# Patient Record
Sex: Female | Born: 1960 | Race: White | Hispanic: No | Marital: Married | Smoking: Never smoker
Health system: Southern US, Community
[De-identification: ages and names within clinical notes are randomized; demographics above are authoritative.]

## PROBLEM LIST (undated history)

## (undated) DIAGNOSIS — Z86018 Personal history of other benign neoplasm: Secondary | ICD-10-CM

## (undated) DIAGNOSIS — K219 Gastro-esophageal reflux disease without esophagitis: Secondary | ICD-10-CM

## (undated) DIAGNOSIS — T7840XA Allergy, unspecified, initial encounter: Secondary | ICD-10-CM

## (undated) HISTORY — DX: Allergy, unspecified, initial encounter: T78.40XA

## (undated) HISTORY — DX: Gastro-esophageal reflux disease without esophagitis: K21.9

## (undated) HISTORY — DX: Personal history of other benign neoplasm: Z86.018

## (undated) HISTORY — PX: EYE SURGERY: SHX253

## (undated) HISTORY — PX: UPPER GASTROINTESTINAL ENDOSCOPY: SHX188

## (undated) HISTORY — PX: SHOULDER SURGERY: SHX246

---

## 2009-05-01 HISTORY — PX: COLONOSCOPY: SHX174

## 2011-11-07 DIAGNOSIS — T781XXA Other adverse food reactions, not elsewhere classified, initial encounter: Secondary | ICD-10-CM | POA: Insufficient documentation

## 2012-01-18 HISTORY — PX: CHOLECYSTECTOMY: SHX55

## 2012-03-21 ENCOUNTER — Encounter: Payer: Self-pay | Admitting: Internal Medicine

## 2012-03-21 ENCOUNTER — Ambulatory Visit (INDEPENDENT_AMBULATORY_CARE_PROVIDER_SITE_OTHER): Payer: BC Managed Care – PPO | Admitting: Internal Medicine

## 2012-03-21 ENCOUNTER — Other Ambulatory Visit (INDEPENDENT_AMBULATORY_CARE_PROVIDER_SITE_OTHER): Payer: BC Managed Care – PPO

## 2012-03-21 VITALS — BP 90/58 | HR 70 | Ht 62.75 in | Wt 120.0 lb

## 2012-03-21 DIAGNOSIS — R109 Unspecified abdominal pain: Secondary | ICD-10-CM

## 2012-03-21 DIAGNOSIS — R140 Abdominal distension (gaseous): Secondary | ICD-10-CM

## 2012-03-21 LAB — CBC WITH DIFFERENTIAL/PLATELET
Basophils Relative: 0.2 % (ref 0.0–3.0)
Eosinophils Absolute: 0.1 10*3/uL (ref 0.0–0.7)
Eosinophils Relative: 0.8 % (ref 0.0–5.0)
HCT: 41.4 % (ref 36.0–46.0)
Lymphs Abs: 1.6 10*3/uL (ref 0.7–4.0)
MCHC: 34.2 g/dL (ref 30.0–36.0)
MCV: 92.7 fl (ref 78.0–100.0)
Monocytes Absolute: 0.4 10*3/uL (ref 0.1–1.0)
Platelets: 250 10*3/uL (ref 150.0–400.0)
RBC: 4.47 Mil/uL (ref 3.87–5.11)
WBC: 8.4 10*3/uL (ref 4.5–10.5)

## 2012-03-21 LAB — COMPREHENSIVE METABOLIC PANEL
ALT: 21 U/L (ref 0–35)
Albumin: 4 g/dL (ref 3.5–5.2)
BUN: 8 mg/dL (ref 6–23)
Calcium: 9.4 mg/dL (ref 8.4–10.5)
GFR: 77.57 mL/min (ref >60.00)
Glucose, Bld: 87 mg/dL (ref 70–99)
Total Bilirubin: 0.6 mg/dL (ref 0.3–1.2)
Total Protein: 6.7 g/dL (ref 6.0–8.3)

## 2012-03-21 NOTE — Patient Instructions (Addendum)
Your physician has requested that you go to the basement for lab work before leaving today.  We will contact you with the results.  Go back on gluten for two weeks and then come back to our lab and get a blood test.  The lab is open 8-5, no appointment needed.  Today you have signed a records release, please contact us back with the Doctor's name in [place] so we may get those records.  Thank you for choosing me and [facility].  Iva Boop, M.D., [facility]'    Lab released the TTG order by mistake, put it back in to be done after 2 weeks of gluten intake.

## 2012-03-21 NOTE — Progress Notes (Addendum)
Subjective:    Patient ID: Kaylee Gentry, female    DOB: [birth year], 52 y.o.   MRN: [id number]  HPI This patient is self-referred regarding multiple GI complaints. She moved to this area 1.5 yrs ago, from [place] region. She says in June 2013 she stopped yogurt and developed bilateral upper abdominal pain after eating. She then developed lower abdominal pain in October. She decided to go gluten free - mixed results, not a lot better. Still bloated. Has regurgitation and "reflux" at times. Bowel habits alternate loose and hard.   Reports 4 years of lower lip pain of unclear etiology despite multiple evaluations. ? Of reaction to lip balm. The pain and swelling markedly decreased when she stopped gluten.  She wonders if she has food allergies, believes she does, Had testing at [facility] clinic - al negative but says she had her appointment moved 1 day ahead by the clinic so still had allegra in her system - she has a calculation of the half-life and amount remaining in her system with her. Also  Reports allergies to carts, dogs, trees, fungi and mildew.  She has been trying unpasteurized apple cider vinegar for "low acid". Wants to be tested for that. Says there is "low T3 in my family and I need testing". Brings labs from past several yrs and all ok, including TSH, FT4, CRP.  She is very active with tennis, skiing and other sports. Noticed she was more dyspneic at altitude this year. Believes she gets short of breath when drinking milk.  Colonoscopy 2 yrs ago ok she reports-requested records EGD 29yrs ago hiatal hernia  Allergies  Allergen Reactions  . Omnicef (Cefdinir)   . Septra (Sulfamethoxazole W-Trimethoprim)   . Sulfa Antibiotics    No outpatient prescriptions prior to visit.   No facility-administered medications prior to visit.   History reviewed. No pertinent past medical history. Past Surgical History  Procedure Laterality Date  . Eye surgery      iridotomy; bilateral  .  Shoulder surgery      right  . Upper gastrointestinal endoscopy    . Colonoscopy     History   Social History  . Marital Status: Married                 Occupational History  . Emergency planning/management officer    Social History Main Topics  . Smoking status: Never Smoker   . Smokeless tobacco: Never Used  . Alcohol Use: No  . Drug Use: No  .       Family History  Problem Relation Age of Onset  . Hypothyroidism Mother   . Hypothyroidism Maternal Uncle     x 2  . Hypertension Maternal Uncle     x 2    Review of Systems + fatigue after exercise, "brain fog", bilat knee pain, muscle tightness, dyspnea All other ROS + or as per HPI    Objective:   Physical Exam General:  Well-developed, well-nourished and in no acute distress Eyes:  anicteric. ENT:   Mouth and posterior pharynx free of lesions.  Neck:   supple w/o thyromegaly or mass.  Lungs: Clear to auscultation bilaterally. Heart:  S1S2, no rubs, murmurs, gallops. Abdomen:  soft, non-tender, no hepatosplenomegaly, hernia, or mass and BS+.  Lymph:  no cervical or supraclavicular adenopathy. Extremities:   no edema Skin   no rash. Neuro:  A&O x 3.  Psych:  appropriate mood and  Affect.   Data Reviewed:  As in HPI She has no  food allergiy tests + including cow's milk and meat Have requested colonoscopy report - returned - reviewed 05/01/2009 - normal except hypertrophy of anal papillae    Assessment & Plan:  Bloating - Plan: CBC with Differential, Comprehensive metabolic panel, TSH, IgA, T3, free, T4, free, Vitamin D (25 hydroxy), Tissue transglutaminase, IgA,  Abdominal pain of multiple sites - Plan: CBC with Differential, Comprehensive metabolic panel, TSH, IgA, T3, free, T4, free, Vitamin D (25 hydroxy), Tissue transglutaminase, IgA, transglutaminase, IgA   She has a variety of symptoms that are most suggestive of IBS.  She could have celiac disease but needs to resume gluten x 2 weeks and then will test TTG Ab. ?  FODMAPS diet intervention ? Allergies - seems unlikely even with traces of Allegra in her system   Seems like there is significant health related anxiety also     Lab Results  Component Value Date   TSH 1.41 03/21/2012   FT3 and FT4 normal    Chemistry      Component Value Date/Time   NA 140 03/21/2012 1033   K 4.8 03/21/2012 1033   CL 104 03/21/2012 1033   CO2 28 03/21/2012 1033   BUN 8 03/21/2012 1033   CREATININE 0.8 03/21/2012 1033      Component Value Date/Time   CALCIUM 9.4 03/21/2012 1033   ALKPHOS 55 03/21/2012 1033   AST 20 03/21/2012 1033   ALT 21 03/21/2012 1033   BILITOT 0.6 03/21/2012 1033     Lab Results  Component Value Date   WBC 8.4 03/21/2012   HGB 14.2 03/21/2012   HCT 41.4 03/21/2012   MCV 92.7 03/21/2012   PLT 250.0 03/21/2012  . Vit D NL at 38  Cc: Junie Panning, MD

## 2012-03-22 ENCOUNTER — Telehealth: Payer: Self-pay | Admitting: Internal Medicine

## 2012-03-22 ENCOUNTER — Encounter: Payer: BC Managed Care – PPO | Admitting: Internal Medicine

## 2012-03-22 LAB — VITAMIN D 25 HYDROXY (VIT D DEFICIENCY, FRACTURES): Vit D, 25-Hydroxy: 38 ng/mL (ref 30–89)

## 2012-03-22 NOTE — Telephone Encounter (Signed)
Faxed ROI to # [phone], formerly [facility] in [place], now [facility]. Phone # [phone].

## 2012-03-23 ENCOUNTER — Encounter: Payer: Self-pay | Admitting: Internal Medicine

## 2012-03-27 ENCOUNTER — Encounter: Payer: Self-pay | Admitting: Internal Medicine

## 2012-03-30 ENCOUNTER — Telehealth: Payer: Self-pay | Admitting: Internal Medicine

## 2012-03-30 ENCOUNTER — Encounter: Payer: Self-pay | Admitting: Internal Medicine

## 2012-03-30 NOTE — Telephone Encounter (Signed)
Patient also sent a mychart message.  I have responded to her in it

## 2012-04-02 DIAGNOSIS — R5381 Other malaise: Secondary | ICD-10-CM | POA: Insufficient documentation

## 2012-04-05 ENCOUNTER — Other Ambulatory Visit: Payer: BC Managed Care – PPO

## 2012-04-05 DIAGNOSIS — R109 Unspecified abdominal pain: Secondary | ICD-10-CM

## 2012-04-10 ENCOUNTER — Encounter: Payer: Self-pay | Admitting: Internal Medicine

## 2012-04-12 NOTE — Progress Notes (Signed)
Quick Note:  Celiac testing is negative - do not have celiac disease based upon this result  Ask her how she is doing? ______

## 2012-04-13 ENCOUNTER — Encounter: Payer: Self-pay | Admitting: Internal Medicine

## 2012-04-17 DIAGNOSIS — Z889 Allergy status to unspecified drugs, medicaments and biological substances status: Secondary | ICD-10-CM | POA: Insufficient documentation

## 2012-05-01 ENCOUNTER — Ambulatory Visit: Payer: Self-pay | Admitting: Internal Medicine

## 2012-05-02 LAB — HM PAP SMEAR: HM Pap smear: NORMAL

## 2012-05-02 LAB — HM MAMMOGRAPHY: HM Mammogram: NORMAL

## 2012-07-02 ENCOUNTER — Encounter: Payer: Self-pay | Admitting: Internal Medicine

## 2012-07-02 ENCOUNTER — Ambulatory Visit (INDEPENDENT_AMBULATORY_CARE_PROVIDER_SITE_OTHER): Payer: BC Managed Care – PPO | Admitting: Internal Medicine

## 2012-07-02 VITALS — BP 100/60 | HR 61 | Temp 98.4°F | Resp 14 | Ht 63.0 in | Wt 115.5 lb

## 2012-07-02 DIAGNOSIS — F321 Major depressive disorder, single episode, moderate: Secondary | ICD-10-CM

## 2012-07-02 DIAGNOSIS — K5229 Other allergic and dietetic gastroenteritis and colitis: Secondary | ICD-10-CM

## 2012-07-02 MED ORDER — BUPROPION HCL ER (XL) 150 MG PO TB24: 150.0000 mg | ORAL_TABLET | Freq: Every day | ORAL | Status: DC

## 2012-07-02 NOTE — Patient Instructions (Addendum)
I will look into a referral to a food allergist at [facility]  Return in 1 month  Wellbutrin 150 mg sent to [facility].  Call if too $$$$$

## 2012-07-02 NOTE — Progress Notes (Signed)
Patient ID: Kaylee Gentry, female   DOB: [birth year], 52 y.o.   MRN: [id number]     Patient Active Problem List   Diagnosis Date Noted  . Gastrointestinal food allergy syndrome 07/02/2012  . Major depressive disorder, single episode, moderate 07/02/2012    Subjective:  CC:   Chief Complaint  Patient presents with  . Establish Care    fatigue, maintaining weight, brain fog.brittle nails,    HPI:   Kaylee Gentry is a 52 y.o. female who presents as a new patient to establish primary care with the chief complaint of  "I have lots of complaints ."  Patient presents with a 3 page typed  list of myriad detailed bodily dysfunctions and perceived food reactions.  She describes herself as having significant health problems which she attributes to an adverse reaction to [facility] which occurred in Feb 2009 when she took to for a skin lesion that patient self diagnosed as a tick bite/ Lyme disesae (no tick seen, patient had a skin lesion in Feb in [place] ) .  Reactions started with lip pain and swelling which occurred while skiiing in [place].which have been recurrent and episodic despite restriction of diet including avoidance of dairy and gluten and finally sugar. Has been losing weight steadily  10 lb since Crist[place],  Unintentional. . Currently on a preservative free diet by [facility] GI doctor.   Had a colonoscopy in 2011 for internal hemorrhoid in [place].  She is having a repeat Colonoscopy at [facility] this Thursday with follow up by Catheryn Bacon, MD who has placed her on a fermentation free diet,  Tested for bacterial overgrowth test (hydrogen test) which was negative , and the serologies for celiac disease were negative. Stools are formed  99% of the time and large, over 12 inches long .     She reports significant e joint pain   Hand joints have developed swelling and she feels that her joints swell depending on what she eats.  She ate salmon one month ago, and here is what happened:  She developed an immediate  headache,  Eyes got blood shot and became exhausted and had to rest and nap frequently for 2 days . Same thing happened when she took a probiotic .  Has had dark circles under her eyes since then, Ringing in her ears,   High pitched ,  Persistent for 6 weeks,  now coming and going.  Had food testing done by Dr .Vickki Hearing , Nov 2013,  skin prick test was normal but she is convinced that her dose of Allegra taken 5 days prior to the skin prick test was responsible for the normal test.  Currently taking Allegra for seasonal allergies.    Stressors: Started working in [place] in 2009,  previously laid off in [place] and moved up to [place] in June 2008 and started working in Jan 2009. Laid off again in 2013.  fatigue has worsened.  She notes that she feels great  after fasting for two days    Thyroid has been checked multiple times, including March , has been normal.  Has not had a PCP in several years, since she relocated from [place].  Retired Emergency planning/management officer since 2013.  She has been drinking  apple cider vinegar for treatment of reflux for the past several months,   States that her famil has a "low acid" problem.   Previously on wellbutrin 10 years ago for depression related to chronic dizziness for 1.5 yr.  Never saw a psychiatrist.  Only a therapist   Past Medical History  Diagnosis Date  . Allergy   . GERD (gastroesophageal reflux disease)     Past Surgical History  Procedure Laterality Date  . Eye surgery      iridotomy; bilateral  . Shoulder surgery      right  . Upper gastrointestinal endoscopy    . Colonoscopy  05/01/2009    normal    Family History  Problem Relation Age of Onset  . Hypothyroidism Mother   . Hypothyroidism Maternal Uncle     x 2  . Hypertension Maternal Uncle     x 2  . Cancer Father   . Hypothyroidism Sister     History   Social History  . Marital Status: Married    Spouse Name: N/A    Number of Children: N/A  . Years of Education: N/A   Occupational History  .  Emergency planning/management officer    Social History Main Topics  . Smoking status: Never Smoker   . Smokeless tobacco: Never Used  . Alcohol Use: No  . Drug Use: No  . Sexually Active: Not on file   Other Topics Concern  . Not on file   Social History Narrative  . No narrative on file   Allergies  Allergen Reactions  . Omnicef (Cefdinir)   . Septra (Sulfamethoxazole W-Trimethoprim)   . Sulfa Antibiotics       Review of Systems:   The remainder of the review of systems was negative except those addressed in the HPI.    Objective:  BP 100/60  Pulse 61  Temp(Src) 98.4 F (36.9 C) (Oral)  Resp 14  Ht 5\' 3"  (1.6 m)  Wt 115 lb 8 oz (52.39 kg)  BMI 20.46 kg/m2  SpO2 99%  General appearance: alert, cooperative and appears stated age Ears: normal TM's and external ear canals both ears Throat: lips, mucosa, and tongue normal; teeth and gums normal Neck: no adenopathy, no carotid bruit, supple, symmetrical, trachea midline and thyroid not enlarged, symmetric, no tenderness/mass/nodules Back: symmetric, no curvature. ROM normal. No CVA tenderness. Lungs: clear to auscultation bilaterally Heart: regular rate and rhythm, S1, S2 normal, no murmur, click, rub or gallop Abdomen: soft, non-tender; bowel sounds normal; no masses,  no organomegaly Pulses: 2+ and symmetric Skin: Skin color, texture, turgor normal. No rashes or lesions Lymph nodes: Cervical, supraclavicular, and axillary nodes normal.  Assessment and Plan:  Gastrointestinal food allergy syndrome Patient appears to have a very long history of food intolerances and weight loss  Accompanied by joint pain. No prior rheumatologic evaluation. Patient has sought a second opinion after serologic testing for celiac disease and a comprehensive metabolic , CBC, ESr, and thyroid studies were all normal.   She has a colonoscopy planned at [facility].  If negative I am recommending repeat food allergy testing off of antihistamines, and if negative,  psychiatry consult   Major depressive disorder, single episode, moderate Patient is requesting resumption of wellbutrin for recurrence of depression due to health problems.  Will consider zoloft in the future given her significant obsessive features.    Updated Medication List Outpatient Encounter Prescriptions as of 07/02/2012  Medication Sig Dispense Refill  . cholecalciferol (VITAMIN D) 400 UNITS TABS Take 400 Units by mouth daily.      . magnesium 30 MG tablet Take 30 mg by mouth. Take one tablet by mouth three times a week      . Misc Natural Products (APPLE CIDER VINEGAR DIET PO) Take by  mouth.      . Multiple Vitamins-Minerals (MULTIVITAMIN WITH MINERALS) tablet Take 1 tablet by mouth daily.      Jeananne Rama Sulfate (EYE DROPS A/C OP) Apply 1 drop to eye 4 (four) times daily - after meals and at bedtime.      Marland Kitchen acetaminophen (TYLENOL) 500 MG tablet Take 500 mg by mouth every 6 (six) hours as needed for pain.      Marland Kitchen buPROPion (WELLBUTRIN XL) 150 MG 24 hr tablet Take 1 tablet (150 mg total) by mouth daily.  30 tablet  2  . ibuprofen (ADVIL,MOTRIN) 100 MG chewable tablet Chew 100 mg by mouth every 8 (eight) hours as needed for fever.      . Lactase 3000 UNITS CHEW Chew 1 tablet by mouth daily.      Marland Kitchen PANCREATIN PO Take 1 tablet by mouth daily.       No facility-administered encounter medications on file as of 07/02/2012.     Orders Placed This Encounter  Procedures  . HM MAMMOGRAPHY  . HM PAP SMEAR  . HM COLONOSCOPY    No Follow-up on file.

## 2012-07-02 NOTE — Assessment & Plan Note (Signed)
Patient is requesting resumption of wellbutrin for recurrence of depression due to health problems.  Will consider zoloft in the future given her significant obsessive features.

## 2012-07-02 NOTE — Assessment & Plan Note (Addendum)
Patient appears to have a very long history of food intolerances and weight loss  Accompanied by joint pain. No prior rheumatologic evaluation. Patient has sought a second opinion after serologic testing for celiac disease and a comprehensive metabolic , CBC, ESr, and thyroid studies were all normal.   She has a colonoscopy planned at [facility].  If negative I am recommending repeat food allergy testing off of antihistamines, and if negative, referral to rheumatology for evaluation of history of angioedema as a cause of her symptoms.

## 2012-07-12 ENCOUNTER — Telehealth: Payer: Self-pay | Admitting: Emergency Medicine

## 2012-07-12 DIAGNOSIS — K9049 Malabsorption due to intolerance, not elsewhere classified: Secondary | ICD-10-CM

## 2012-07-12 NOTE — Telephone Encounter (Signed)
No I have not . Amber have you ever made a referral to an allergy and immunology specialist at [facility]? I will place referral.

## 2012-07-12 NOTE — Telephone Encounter (Signed)
Pt was calling asking if we have found a food allergy spec @ [facility]. Please advise

## 2012-08-01 ENCOUNTER — Ambulatory Visit: Payer: BC Managed Care – PPO | Admitting: Internal Medicine

## 2012-08-02 ENCOUNTER — Ambulatory Visit: Payer: BC Managed Care – PPO | Admitting: Internal Medicine

## 2012-08-06 ENCOUNTER — Ambulatory Visit (INDEPENDENT_AMBULATORY_CARE_PROVIDER_SITE_OTHER): Payer: BC Managed Care – PPO | Admitting: Adult Health

## 2012-08-06 ENCOUNTER — Telehealth: Payer: Self-pay | Admitting: Internal Medicine

## 2012-08-06 ENCOUNTER — Encounter: Payer: Self-pay | Admitting: Adult Health

## 2012-08-06 VITALS — BP 100/66 | HR 68 | Temp 97.9°F | Resp 12 | Wt 115.0 lb

## 2012-08-06 DIAGNOSIS — R197 Diarrhea, unspecified: Secondary | ICD-10-CM

## 2012-08-06 DIAGNOSIS — R1012 Left upper quadrant pain: Secondary | ICD-10-CM

## 2012-08-06 LAB — CBC WITH DIFFERENTIAL/PLATELET
Basophils Relative: 0.5 % (ref 0.0–3.0)
Eosinophils Absolute: 0.1 10*3/uL (ref 0.0–0.7)
Eosinophils Relative: 2 % (ref 0.0–5.0)
HCT: 40.5 % (ref 36.0–46.0)
Hemoglobin: 13.8 g/dL (ref 12.0–15.0)
Lymphs Abs: 1.9 10*3/uL (ref 0.7–4.0)
MCHC: 34 g/dL (ref 30.0–36.0)
MCV: 95.9 fl (ref 78.0–100.0)
Monocytes Absolute: 0.3 10*3/uL (ref 0.1–1.0)
Neutro Abs: 4.1 10*3/uL (ref 1.4–7.7)
RBC: 4.22 Mil/uL (ref 3.87–5.11)
WBC: 6.4 10*3/uL (ref 4.5–10.5)

## 2012-08-06 NOTE — Telephone Encounter (Signed)
Patient Information:  Caller Name: Marisha  Phone: [phone]  Patient: Kaylee Gentry, Kaylee Gentry  Gender: Female  DOB: 02-22-1960  Age: 52 Years  PCP: Duncan Dull (Adults only)  Pregnant: No  Office Follow Up:  Does the office need to follow up with this patient?: No  Instructions For The Office: N/A  RN Note:  States has nausea which has awakened her at night, but no overt vomiting.  Loose stools x 2.  Mild dizziness noted.  States nausea occurs more often after eating. Occasional irregular heartbeat, but this is fleeting per patient.  Per vomiting/nausea protocol, emergent symptoms currently denied; advised appt within 24 hours.  Per Epic, patient has appt scheduled already 08/06/12 at 1400 with Ms. Hassie Bruce.  Will keep appt at that time.  krs/can  Symptoms  Reason For Call & Symptoms: nausea  Reviewed Health History In EMR: Yes  Reviewed Medications In EMR: Yes  Reviewed Allergies In EMR: Yes  Reviewed Surgeries / Procedures: Yes  Date of Onset of Symptoms: 07/30/2012 OB / GYN:  LMP: 07/30/2012  Guideline(s) Used:  Vomiting  Disposition Per Guideline:   See Today in Office  Reason For Disposition Reached:   Patient wants to be seen  Advice Given:  N/A  Patient Will Follow Care Advice:  YES  Appointment Scheduled:  08/06/2012 14:00:00 Appointment Scheduled Provider:  Orville Govern

## 2012-08-06 NOTE — Assessment & Plan Note (Signed)
Pain reported after meals. In particular, she reports inability to tolerate fatty foods. Will send patient for ultrasound to evaluate for gallstones.

## 2012-08-06 NOTE — Assessment & Plan Note (Signed)
2 episodes of diarrhea. Patient is concerned with contracting some bacteria or parasites. Check CBC. Also ordered stool culture, ova and parasites.
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
## 2012-08-07 ENCOUNTER — Ambulatory Visit: Payer: BC Managed Care – PPO | Admitting: Adult Health

## 2012-08-09 ENCOUNTER — Encounter: Payer: Self-pay | Admitting: Internal Medicine

## 2012-08-09 ENCOUNTER — Telehealth: Payer: Self-pay | Admitting: Internal Medicine

## 2012-08-09 ENCOUNTER — Ambulatory Visit (INDEPENDENT_AMBULATORY_CARE_PROVIDER_SITE_OTHER): Payer: BC Managed Care – PPO | Admitting: Internal Medicine

## 2012-08-09 VITALS — BP 94/56 | HR 61 | Temp 97.6°F | Resp 14 | Wt 116.5 lb

## 2012-08-09 DIAGNOSIS — R1012 Left upper quadrant pain: Secondary | ICD-10-CM

## 2012-08-09 DIAGNOSIS — Z1239 Encounter for other screening for malignant neoplasm of breast: Secondary | ICD-10-CM

## 2012-08-09 DIAGNOSIS — Z13820 Encounter for screening for osteoporosis: Secondary | ICD-10-CM

## 2012-08-09 NOTE — Patient Instructions (Addendum)
Bone density test to  Be ordered.  Will let yo know if insurance will pay based on FH of osteopenia  Consider referral to genetics counsellor for BRCA testing  We will call you with the results of the abd ultrasound

## 2012-08-09 NOTE — Progress Notes (Signed)
Patient ID: Kaylee Gentry, female   DOB: [birth year], 52 y.o.   MRN: [id number]   Patient Active Problem List   Diagnosis Date Noted  . Screening for osteoporosis 08/12/2012  . Abdominal pain, left upper quadrant 08/06/2012  . Diarrhea 08/06/2012  . Gastrointestinal food allergy syndrome 07/02/2012  . Major depressive disorder, single episode, moderate 07/02/2012    Subjective:  CC:   Chief Complaint  Patient presents with  . Follow-up    1 month from upper left abdominal pain. Patient stated that it worse with eating.    HPI:   Kaylee Gentry a 52 y.o. female who presents 1 month follow up on multiple medical issues.    She stopped the wellbutrin XR due to insomnia. May decide to resume it with the short acting.   She had a Colonoscopy done on June 19th.   She discussed her food intolerance symptoms with GI doctor.  He recommended an SSRI  For IBS in the near future,  Dr Catheryn Bacon.    Nausea which started on July 19th has resolved, but she continues to report post prandial RUQ pain. Marland Kitchen Ultrasound was done yesterday at [facility],  No ductal dilation.  Tiny mobile stones seen.   She felt a bulge around  around her rectum recently  Has a repeat appt tomorrow.    Inquiring about bone density testing and BRCA analysis.  Sister was tested recently and she wants to be tested  Sister was told she had osteopenia. Mother had osteopenia and GM has osteoporosis. referral to oncology.    Immunizations last Td 2005.    Hepatits a & B in 2005.  Wants to come back to get the TDaP     Past Medical History  Diagnosis Date  . Allergy   . GERD (gastroesophageal reflux disease)     Past Surgical History  Procedure Laterality Date  . Eye surgery      iridotomy; bilateral  . Shoulder surgery      right  . Upper gastrointestinal endoscopy    . Colonoscopy  05/01/2009    normal       The following portions of the patient's history were reviewed and updated as appropriate: Allergies,  current medications, and problem list.    Review of Systems:   12 Pt  review of systems was negative except those addressed in the HPI,     History   Social History  . Marital Status: Married    Spouse Name: N/A    Number of Children: N/A  . Years of Education: N/A   Occupational History  . Emergency planning/management officer    Social History Main Topics  . Smoking status: Never Smoker   . Smokeless tobacco: Never Used  . Alcohol Use: No  . Drug Use: No  . Sexually Active: Not on file   Other Topics Concern  . Not on file   Social History Narrative  . No narrative on file    Objective:  BP 94/56  Pulse 61  Temp(Src) 97.6 F (36.4 C) (Oral)  Resp 14  Wt 116 lb 8 oz (52.844 kg)  BMI 20.64 kg/m2  SpO2 99%  LMP 07/30/2012  General appearance: alert, cooperative and appears stated age Ears: normal TM's and external ear canals both ears Throat: lips, mucosa, and tongue normal; teeth and gums normal Neck: no adenopathy, no carotid bruit, supple, symmetrical, trachea midline and thyroid not enlarged, symmetric, no tenderness/mass/nodules Back: symmetric, no curvature. ROM normal. No CVA tenderness. Lungs: clear  to auscultation bilaterally Heart: regular rate and rhythm, S1, S2 normal, no murmur, click, rub or gallop Abdomen: soft, non-tender; bowel sounds normal; no masses,  no organomegaly Pulses: 2+ and symmetric Skin: Skin color, texture, turgor normal. No rashes or lesions Lymph nodes: Cervical, supraclavicular, and axillary nodes normal.  Assessment and Plan:  Abdominal pain, left upper quadrant No signs of cholecystitis on ultrasound and lfts normal.   Screening for osteoporosis Requesting screening due to FH of osteopenia  in first degree relatives.    Updated Medication List Outpatient Encounter Prescriptions as of 08/09/2012  Medication Sig Dispense Refill  . acetaminophen (TYLENOL) 500 MG tablet Take 500 mg by mouth every 6 (six) hours as needed for pain.      .  cholecalciferol (VITAMIN D) 400 UNITS TABS Take 400 Units by mouth daily.      . fexofenadine (ALLEGRA) 180 MG tablet Take 180 mg by mouth daily.      Marland Kitchen ibuprofen (ADVIL,MOTRIN) 100 MG chewable tablet Chew 100 mg by mouth every 8 (eight) hours as needed for fever.      . magnesium 30 MG tablet Take 30 mg by mouth. Take one tablet by mouth three times a week      . Misc Natural Products (APPLE CIDER VINEGAR DIET PO) Take by mouth.      . Multiple Vitamins-Minerals (MULTIVITAMIN WITH MINERALS) tablet Take 1 tablet by mouth daily.      Jeananne Rama Sulfate (EYE DROPS A/C OP) Apply 1 drop to eye 4 (four) times daily - after meals and at bedtime.      Marland Kitchen buPROPion (WELLBUTRIN XL) 150 MG 24 hr tablet Take 1 tablet (150 mg total) by mouth daily.  30 tablet  2  . Lactase 3000 UNITS CHEW Chew 1 tablet by mouth daily.      Marland Kitchen PANCREATIN PO Take 1 tablet by mouth daily.       No facility-administered encounter medications on file as of 08/09/2012.     Orders Placed This Encounter  Procedures  . DG Bone Density  . Ambulatory referral to Genetics    No Follow-up on file.

## 2012-08-12 ENCOUNTER — Encounter: Payer: Self-pay | Admitting: Internal Medicine

## 2012-08-12 DIAGNOSIS — Z13820 Encounter for screening for osteoporosis: Secondary | ICD-10-CM | POA: Insufficient documentation

## 2012-08-12 NOTE — Assessment & Plan Note (Signed)
No signs of cholecystitis on ultrasound and lfts normal.

## 2012-08-12 NOTE — Assessment & Plan Note (Signed)
Requesting screening due to FH of osteopenia  in first degree relatives.

## 2012-08-16 ENCOUNTER — Encounter: Payer: Self-pay | Admitting: *Deleted

## 2012-08-21 DIAGNOSIS — K219 Gastro-esophageal reflux disease without esophagitis: Secondary | ICD-10-CM | POA: Insufficient documentation

## 2012-08-21 DIAGNOSIS — R112 Nausea with vomiting, unspecified: Secondary | ICD-10-CM | POA: Insufficient documentation

## 2012-09-03 ENCOUNTER — Telehealth: Payer: Self-pay | Admitting: Internal Medicine

## 2012-09-03 DIAGNOSIS — R5383 Other fatigue: Secondary | ICD-10-CM

## 2012-09-03 DIAGNOSIS — K219 Gastro-esophageal reflux disease without esophagitis: Secondary | ICD-10-CM

## 2012-09-03 NOTE — Telephone Encounter (Signed)
Please advise of labs and will call patient and set up appointment and follow up if needed.

## 2012-09-03 NOTE — Telephone Encounter (Signed)
Yes, they have been ordered

## 2012-09-03 NOTE — Telephone Encounter (Signed)
Pt asking if she can have her adrenal function checked due to some sluggishness.  Also had gastrin checked by GI which came back less than 10.  Pt states GI doctor told her to have gastrin checked in another lab; asking if she can also have this done here.  Pt states should be fasting labs.  Not scheduled.  Please advise.

## 2012-09-10 ENCOUNTER — Telehealth: Payer: Self-pay | Admitting: *Deleted

## 2012-09-10 ENCOUNTER — Other Ambulatory Visit (INDEPENDENT_AMBULATORY_CARE_PROVIDER_SITE_OTHER): Payer: BC Managed Care – PPO

## 2012-09-10 DIAGNOSIS — R5383 Other fatigue: Secondary | ICD-10-CM

## 2012-09-10 DIAGNOSIS — K219 Gastro-esophageal reflux disease without esophagitis: Secondary | ICD-10-CM

## 2012-09-10 DIAGNOSIS — R5381 Other malaise: Secondary | ICD-10-CM

## 2012-09-10 DIAGNOSIS — E559 Vitamin D deficiency, unspecified: Secondary | ICD-10-CM

## 2012-09-10 LAB — VITAMIN B12: Vitamin B-12: 406 pg/mL (ref 211–911)

## 2012-09-10 LAB — TSH: TSH: 0.88 u[IU]/mL (ref 0.35–5.50)

## 2012-09-10 NOTE — Addendum Note (Signed)
Addended by: Sherlene Shams on: 09/10/2012 01:12 PM   Modules accepted: Orders

## 2012-09-10 NOTE — Telephone Encounter (Signed)
It says on the appt. Note to add a B12 and Vitamin, but what dx; also pt would like to add a TSH  And if these results can be faxed to   Dr. Pamala Hurry at [facility] at [phone]

## 2012-09-12 ENCOUNTER — Encounter: Payer: Self-pay | Admitting: *Deleted

## 2012-09-18 DIAGNOSIS — K8010 Calculus of gallbladder with chronic cholecystitis without obstruction: Secondary | ICD-10-CM | POA: Insufficient documentation

## 2012-09-20 ENCOUNTER — Telehealth: Payer: Self-pay | Admitting: Internal Medicine

## 2012-09-20 NOTE — Telephone Encounter (Signed)
Pt states she received msg in mychart and letter that a recent blood test for gastrin is still pending because it was not sent in correctly.  Pt asking what will be done for this.  Also wants to know since other tests were all normal she wants to know what the next step is for why her BP is so low.   Bone density results were not on mychart msg or in letter sent to pt, she is asking about these results.

## 2012-09-24 ENCOUNTER — Encounter: Payer: Self-pay | Admitting: Internal Medicine

## 2012-09-24 NOTE — Telephone Encounter (Signed)
The specimen had thawed out, she will need to come back in for a re-draw

## 2012-09-24 NOTE — Telephone Encounter (Signed)
i have not received the bone density results, find out where she had  It done  Eber Jones was supposed to have called her to return for the redraw.  If there are specific times it must be drawn so it does not thaw out again, let carolyn handle telling the patient  Nothing needs to be done about her "low" blood pressure unless she is fainting. Many people have blood pressures in the 90s , it is not "low"

## 2012-09-24 NOTE — Telephone Encounter (Signed)
Can you advise as to wether the serum gastrin was completed on this patient.

## 2012-09-24 NOTE — Telephone Encounter (Signed)
Please advise to note below patient needs redraw for gastrin serum, and to bone density results I se order for but not results.

## 2012-09-25 NOTE — Telephone Encounter (Signed)
Patient stated bone density completed at [facility] [address]. Called [facility] and will fax over results. Will send to carolyn also.

## 2012-09-25 NOTE — Telephone Encounter (Signed)
Called pt but got no answer, will try again later

## 2012-09-26 ENCOUNTER — Telehealth: Payer: Self-pay | Admitting: Internal Medicine

## 2012-09-26 NOTE — Telephone Encounter (Signed)
Bone density test was normal . She does not have osteoporosis

## 2012-09-26 NOTE — Telephone Encounter (Signed)
Spoke to pt, she said she would call in a week to make an appt to have the re-collect done   She is having surgery this Friday

## 2012-09-27 NOTE — Telephone Encounter (Signed)
Sent myChart message

## 2012-10-02 NOTE — Telephone Encounter (Signed)
Mailed unread message to pt  

## 2012-10-10 ENCOUNTER — Telehealth: Payer: Self-pay | Admitting: *Deleted

## 2012-10-10 DIAGNOSIS — F321 Major depressive disorder, single episode, moderate: Secondary | ICD-10-CM

## 2012-10-10 MED ORDER — BUPROPION HCL 100 MG PO TABS: 100.0000 mg | ORAL_TABLET | Freq: Two times a day (BID) | ORAL | Status: DC

## 2012-10-10 NOTE — Telephone Encounter (Signed)
Patient prescribed wellbutrin XL but has C/O this medication keeps her awake at night on the extended release formula patient wanted to know could she get this in something that is not extended release.

## 2012-10-10 NOTE — Telephone Encounter (Signed)
Yes, i sent the shorter acting 100 mg dose to [facility].  Take second dose by 1 pm or omit completely if it still bothers her sleep

## 2012-10-11 NOTE — Telephone Encounter (Signed)
Left message for pt to return my call.

## 2012-10-17 ENCOUNTER — Ambulatory Visit: Payer: Self-pay

## 2012-10-19 ENCOUNTER — Telehealth: Payer: Self-pay | Admitting: Internal Medicine

## 2012-10-19 DIAGNOSIS — R1012 Left upper quadrant pain: Secondary | ICD-10-CM

## 2012-10-19 DIAGNOSIS — K5229 Other allergic and dietetic gastroenteritis and colitis: Secondary | ICD-10-CM

## 2012-10-19 NOTE — Telephone Encounter (Signed)
Patient wanting a gastrin level and iron drawn.

## 2012-10-19 NOTE — Telephone Encounter (Signed)
I will but she will need to follow up with  her gi doctor regarding the interpretation of it because it is not something I have ever used before

## 2012-10-23 NOTE — Telephone Encounter (Signed)
Patient stated this gastrin is to be a redraw because previous level was not handled properly, please advise.

## 2012-10-23 NOTE — Telephone Encounter (Signed)
Labs are ordered 

## 2012-11-06 ENCOUNTER — Ambulatory Visit: Payer: Self-pay

## 2012-11-08 ENCOUNTER — Telehealth: Payer: Self-pay | Admitting: Adult Health

## 2012-11-08 NOTE — Telephone Encounter (Signed)
The patient is in for an appointment on 10.24.14 and she wants to know if she should be seen . She is unable to use her stomach muscles and she wants to know if there is a different way she can be examined than to get on the table. Please advise.

## 2012-11-08 NOTE — Telephone Encounter (Signed)
Left message for pt to return my call.

## 2012-11-09 ENCOUNTER — Encounter: Payer: Self-pay | Admitting: Adult Health

## 2012-11-09 ENCOUNTER — Ambulatory Visit (INDEPENDENT_AMBULATORY_CARE_PROVIDER_SITE_OTHER): Payer: BC Managed Care – PPO | Admitting: Adult Health

## 2012-11-09 ENCOUNTER — Ambulatory Visit: Payer: Self-pay | Admitting: Adult Health

## 2012-11-09 ENCOUNTER — Other Ambulatory Visit: Payer: Self-pay | Admitting: Adult Health

## 2012-11-09 VITALS — BP 98/60 | HR 84 | Temp 98.0°F | Resp 14 | Wt 118.4 lb

## 2012-11-09 DIAGNOSIS — R109 Unspecified abdominal pain: Secondary | ICD-10-CM

## 2012-11-09 DIAGNOSIS — N83209 Unspecified ovarian cyst, unspecified side: Secondary | ICD-10-CM

## 2012-11-09 DIAGNOSIS — M545 Low back pain: Secondary | ICD-10-CM | POA: Insufficient documentation

## 2012-11-09 DIAGNOSIS — D219 Benign neoplasm of connective and other soft tissue, unspecified: Secondary | ICD-10-CM

## 2012-11-09 LAB — CBC WITH DIFFERENTIAL/PLATELET
Basophils Absolute: 0 10*3/uL (ref 0.0–0.1)
Eosinophils Absolute: 0.1 10*3/uL (ref 0.0–0.7)
Lymphocytes Relative: 23.8 % (ref 12.0–46.0)
MCHC: 34.3 g/dL (ref 30.0–36.0)
Neutrophils Relative %: 67 % (ref 43.0–77.0)
RBC: 4.22 Mil/uL (ref 3.87–5.11)
RDW: 12.9 % (ref 11.5–14.6)

## 2012-11-09 LAB — BASIC METABOLIC PANEL
Chloride: 103 mEq/L (ref 96–112)
Potassium: 4.6 mEq/L (ref 3.5–5.1)

## 2012-11-09 NOTE — Assessment & Plan Note (Addendum)
Patient with generalized abdominal pain. She is s/p cholecystectomy 6 weeks ago. Still has the scabs in place from laparoscopy. Her symptoms are very vague. She has been in the office with numerous vague complaints for which nothing has been found thus far. I suspect she may have some mild discomfort from her surgery and the pumping of abdomen with CO2; however, I cannot imagine that it is these severe. Patient has had testing for allergies, celiac dz, followed by GI at [facility] with no findings. She has not had a CT of the abdomen so I am ordering one. I am also checking CBC and bmet. ? Munchhausen syndrome. Note, spent greater than 30 minutes in face to face communication with patient in the history, assessment, evaluation and implementation of care pertaining to this problem.

## 2012-11-09 NOTE — Progress Notes (Signed)
Subjective:    Patient ID: Kaylee Gentry, female    DOB: 30-Mar-1960, 52 y.o.   MRN: [id number]  HPI  Patient is a 52 year old female who presents to clinic with complaints of generalized abdominal pain. She reports that on 09/14/2012 she began to do yoga. She believes that she did an advanced move that she was not ready for and subsequently developed anterior abdominal pain. She points to the area of her rectus abdominis. She took bedrest. She also mentions having allergy testing where they asked her to lie on her abdomen. She felt this further injured her abdomen. She then reports that on 09/28/2012 she had a cholecystectomy. Patient had been seen in clinic on 08/06/2012 with complaints of right upper quadrant pain. Ultrasound done which revealed nonobstructing gallstones. Cholecystectomy was done laparoscopically. Patient reports that she is having significant abdominal pain. Especially anything having to do with engaging her abdominal muscles. Her husband is present during the visit in order to help her get on the exam table. Patient reports that she has unusual sensation and felt a lump at RLQ. Patient "rubbed it out". She had a bowel movement today. No problems reported in that area. She reports that she is having problems with urination since her surgery. Patient reports that she has only "had a good pee 5 times since her surgery". She reports having just a trickle all other times urinating. When she was asked to get on the exam table she was able to moved from a sitting to a standing position very easily and quickly; however, it became a very long process to place the exam table at the perfect angle so that she could be examined.     Past Medical History  Diagnosis Date  . Allergy   . GERD (gastroesophageal reflux disease)      Past Surgical History  Procedure Laterality Date  . Eye surgery      iridotomy; bilateral  . Shoulder surgery      right  . Upper gastrointestinal endoscopy     . Colonoscopy  05/01/2009    normal     Family History  Problem Relation Age of Onset  . Hypothyroidism Mother   . Hypothyroidism Maternal Uncle     x 2  . Hypertension Maternal Uncle     x 2  . Cancer Father   . Hypothyroidism Sister      History   Social History  . Marital Status: Married    Spouse Name: N/A    Number of Children: N/A  . Years of Education: N/A   Occupational History  . Emergency planning/management officer    Social History Main Topics  . Smoking status: Never Smoker   . Smokeless tobacco: Never Used  . Alcohol Use: No  . Drug Use: No  . Sexual Activity: Not on file   Other Topics Concern  . Not on file   Social History Narrative  . No narrative on file      Current Outpatient Prescriptions on File Prior to Visit  Medication Sig Dispense Refill  . acetaminophen (TYLENOL) 500 MG tablet Take 500 mg by mouth every 6 (six) hours as needed for pain.      Marland Kitchen buPROPion (WELLBUTRIN) 100 MG tablet Take 1 tablet (100 mg total) by mouth 2 (two) times daily.  60 tablet  3  . cholecalciferol (VITAMIN D) 400 UNITS TABS Take 400 Units by mouth daily.      Marland Kitchen ibuprofen (ADVIL,MOTRIN) 100 MG chewable tablet Chew 100 mg  by mouth every 8 (eight) hours as needed for fever.      . magnesium 30 MG tablet Take 30 mg by mouth. Take one tablet by mouth three times a week      . Multiple Vitamins-Minerals (MULTIVITAMIN WITH MINERALS) tablet Take 1 tablet by mouth daily.      Jeananne Rama Sulfate (EYE DROPS A/C OP) Apply 1 drop to eye 4 (four) times daily - after meals and at bedtime.      . fexofenadine (ALLEGRA) 180 MG tablet Take 180 mg by mouth daily.      . Lactase 3000 UNITS CHEW Chew 1 tablet by mouth daily.      . Misc Natural Products (APPLE CIDER VINEGAR DIET PO) Take by mouth.      Marland Kitchen PANCREATIN PO Take 1 tablet by mouth daily.       No current facility-administered medications on file prior to visit.      Review of Systems  Constitutional: Negative for fever and  chills.  Respiratory: Negative.   Cardiovascular: Negative.   Gastrointestinal: Positive for abdominal pain. Negative for nausea, vomiting, diarrhea and constipation.       Generalized abdominal pain       Objective:   Physical Exam  Constitutional: She is oriented to person, place, and time. She appears well-developed and well-nourished. No distress.  Abdominal: Soft. Bowel sounds are normal. She exhibits no distension. There is tenderness.  Tenderness with palpation RLQ, LLQ, epigastric area  Neurological: She is alert and oriented to person, place, and time.  Psychiatric:  Patient with flat affect    BP 98/60  Pulse 84  Temp(Src) 98 F (36.7 C) (Oral)  Resp 14  Wt 118 lb 6.4 oz (53.706 kg)  BMI 20.98 kg/m2  SpO2 98%       Assessment & Plan:

## 2012-11-12 ENCOUNTER — Encounter: Payer: Self-pay | Admitting: Internal Medicine

## 2012-11-12 NOTE — Telephone Encounter (Signed)
Pt was seen in office 11/09/12

## 2012-11-13 ENCOUNTER — Telehealth: Payer: Self-pay | Admitting: Internal Medicine

## 2012-11-13 ENCOUNTER — Other Ambulatory Visit (INDEPENDENT_AMBULATORY_CARE_PROVIDER_SITE_OTHER): Payer: BC Managed Care – PPO

## 2012-11-13 DIAGNOSIS — K5229 Other allergic and dietetic gastroenteritis and colitis: Secondary | ICD-10-CM

## 2012-11-13 NOTE — Telephone Encounter (Signed)
Pt came in today for lab work and she had follow up ?Marland Kitchen  Pt had surgery 6 weeks and pt just had ct done Friday.  They told her she had excess gas in abd  She wanted to know what the best way to get this out.

## 2012-11-13 NOTE — Telephone Encounter (Signed)
Pt was sent myChart message, see Dr. Melina Schools message

## 2012-11-14 LAB — IRON AND TIBC
TIBC: 420 ug/dL (ref 250–470)
UIBC: 330 ug/dL (ref 125–400)

## 2012-11-14 LAB — GASTRIN, SERUM: Gastrin: 38 pg/mL (ref 0–115)

## 2012-11-17 ENCOUNTER — Ambulatory Visit: Payer: Self-pay

## 2012-11-19 ENCOUNTER — Encounter: Payer: Self-pay | Admitting: Internal Medicine

## 2012-11-22 ENCOUNTER — Other Ambulatory Visit: Payer: Self-pay

## 2012-11-27 ENCOUNTER — Encounter: Payer: Self-pay | Admitting: Adult Health

## 2012-11-28 ENCOUNTER — Ambulatory Visit (INDEPENDENT_AMBULATORY_CARE_PROVIDER_SITE_OTHER): Payer: BC Managed Care – PPO | Admitting: Adult Health

## 2012-11-28 ENCOUNTER — Encounter: Payer: Self-pay | Admitting: Adult Health

## 2012-11-28 VITALS — BP 112/70 | HR 74 | Temp 98.2°F | Resp 14 | Wt 115.0 lb

## 2012-11-28 DIAGNOSIS — L293 Anogenital pruritus, unspecified: Secondary | ICD-10-CM

## 2012-11-28 DIAGNOSIS — T50905S Adverse effect of unspecified drugs, medicaments and biological substances, sequela: Secondary | ICD-10-CM

## 2012-11-28 DIAGNOSIS — N898 Other specified noninflammatory disorders of vagina: Secondary | ICD-10-CM

## 2012-11-28 DIAGNOSIS — T887XXA Unspecified adverse effect of drug or medicament, initial encounter: Secondary | ICD-10-CM | POA: Insufficient documentation

## 2012-11-28 MED ORDER — NYSTATIN 100000 UNIT/GM EX CREA: 1.0000 "application " | TOPICAL_CREAM | Freq: Two times a day (BID) | CUTANEOUS | Status: DC

## 2012-11-28 NOTE — Assessment & Plan Note (Signed)
Nystatin cream bid to affected area.

## 2012-11-28 NOTE — Assessment & Plan Note (Addendum)
Patient reports tingling in hands and feet after taking 3 days of macrobid. Unable to examine patient 2/2 her fears of contracting illness if I touch her. I believe patient has an underlying, undiagnosed psychiatric condition that may be contributing to her multiple symptoms and ailments. I spoke with her at length about this. Explained to her that the brain, emotions, psyche are just as important to care for as the rest of our body. If any one of our organ systems are altered this brings about illness. I did agree that she see the neurologist to see if there was anything they could offer to alleviate her symptoms. I explained that she took medication for a short time and effects may be reversible. I also suggested that she consider seeing a psychiatrist that may be able to help her anxiety and obsessive thoughts. She agreed that her anxiety contributes to her symptoms and makes them worse. She stated that she would think about it. Note, greater than 60 min were spent in face to face communication with patient pertaining to this problem and multiple concerns. I am checking a cbc and metabolic panel to try to put her somewhat at ease. She fears that the medication has altered her platelets or kidneys.

## 2012-11-28 NOTE — Progress Notes (Signed)
  Subjective:    Patient ID: Kaylee Gentry, female    DOB: February 25, 1960, 52 y.o.   MRN: [id number]  HPI  Patient is a 52 year old female who presents to clinic with reports of having a severe allergic reaction to an antibiotic prescribed by her gynecologist. Patient reports that she was seen by her gynecologist earlier in the month and was prescribed Macrobid. 3 days after starting medication patient reports tingling on her feet and hands. She was seen at the gynecologist office and referred to neurology. She is scheduled to see a neurologist at [facility]. She reports that she has been looking up additional information on Macrobid and is concerned because of potential alteration in her platelets. She is requesting that lab be repeated.  Patient reports that she is feeling vulnerable. She does not like being in clinic because she is aware that there have been other sick patient. She is concerned with contracting an illness. Patient begins to inquire about other patients that I have seen earlier and whether there is something she needs to know about their illness, such as if they are contagious.  She also reports that she is having itching around the vagina. She is requesting medication for the symptom.  Patient reports that she is not certain whether she is allergic to PCN or not. She is going to see an allergist who is going to check this. In the meantime, she wanted to know that if she had a UTI would she develop a resistance to PCN if the allergist gave her a dose to check. I explained to her that resistance does not occur with one dose. It is usually with overly using a medication especially when not needed.    Review of Systems  Neurological: Negative for tremors and weakness.       Tingling no numbness. There is some muscle spasms of the right buttocks       Objective:   Physical Exam  Neurological: She is alert.  Psychiatric:  Patient is exhibiting anxiety and obsessive thoughts.    Note patient did not allow me to examine her. She reported that she was feeling vulnerable and concerned about contracting illness since I see "sick patients" throughout the day.       Assessment & Plan:

## 2012-11-29 ENCOUNTER — Encounter: Payer: Self-pay | Admitting: Obstetrics and Gynecology

## 2012-11-29 ENCOUNTER — Encounter: Payer: Self-pay | Admitting: Adult Health

## 2012-11-29 LAB — CBC WITH DIFFERENTIAL/PLATELET
Basophils Relative: 0.3 % (ref 0.0–3.0)
Eosinophils Relative: 1 % (ref 0.0–5.0)
HCT: 40.1 % (ref 36.0–46.0)
Lymphs Abs: 1.8 10*3/uL (ref 0.7–4.0)
MCV: 93.6 fl (ref 78.0–100.0)
Monocytes Absolute: 0.5 10*3/uL (ref 0.1–1.0)
RBC: 4.28 Mil/uL (ref 3.87–5.11)
WBC: 8.4 10*3/uL (ref 4.5–10.5)

## 2012-11-29 LAB — BASIC METABOLIC PANEL
CO2: 29 mEq/L (ref 19–32)
Calcium: 9.3 mg/dL (ref 8.4–10.5)
Chloride: 102 mEq/L (ref 96–112)
Glucose, Bld: 90 mg/dL (ref 70–99)
Sodium: 137 mEq/L (ref 135–145)

## 2012-12-06 ENCOUNTER — Ambulatory Visit: Payer: Self-pay | Admitting: Orthopedic Surgery

## 2012-12-12 ENCOUNTER — Telehealth: Payer: Self-pay | Admitting: Internal Medicine

## 2012-12-12 DIAGNOSIS — F321 Major depressive disorder, single episode, moderate: Secondary | ICD-10-CM

## 2012-12-12 MED ORDER — SERTRALINE HCL 50 MG PO TABS: 50.0000 mg | ORAL_TABLET | Freq: Every day | ORAL | Status: DC

## 2012-12-12 MED ORDER — BUPROPION HCL 100 MG PO TABS: 100.0000 mg | ORAL_TABLET | Freq: Two times a day (BID) | ORAL | Status: DC

## 2012-12-12 NOTE — Telephone Encounter (Signed)
Patient has not started the Wellbutrin and wants to know if she can do 100 mg a day instead of 200 mg and said it does not have to be female psychologist, please advise.

## 2012-12-12 NOTE — Telephone Encounter (Signed)
If she has not started the wellbutrin I would not start it because I think that zoloft would be a better choice and we can start at a very low dose. And we never give more than a few months of a new medicaiton bc patient needs ot follow up with MD after one month

## 2012-12-12 NOTE — Telephone Encounter (Signed)
Pt called back.  Advised of previous ph msg.  Pt asking if she can take just 100 mg once a day of Wellbutrin or if that is a bad idea?  Please advise pt.

## 2012-12-12 NOTE — Telephone Encounter (Signed)
Pt requesting recommendation from Dr. Darrick Huntsman for a therapist regarding marriage counseling and for herself.  Would like more than one recommendation.  Does not want psychiatrist, they are too expensive and just would like someone who can offer counseling.  States Dr. Darrick Huntsman gave rx for wellbutrin but only for 4 months.  Would like to extend this rx long-term.  States okay to speak with her husband.

## 2012-12-12 NOTE — Telephone Encounter (Signed)
We are compiling a list of several female psychologists in the area for [place] with phone numbers  The wellbutrin has been refilled.

## 2012-12-17 ENCOUNTER — Encounter: Payer: Self-pay | Admitting: Obstetrics and Gynecology

## 2012-12-17 ENCOUNTER — Ambulatory Visit: Payer: BC Managed Care – PPO | Admitting: Internal Medicine

## 2012-12-18 NOTE — Telephone Encounter (Signed)
Left message for patient to return call to office. 

## 2012-12-26 ENCOUNTER — Encounter: Payer: Self-pay | Admitting: Internal Medicine

## 2012-12-28 ENCOUNTER — Ambulatory Visit: Payer: Self-pay

## 2012-12-28 MED ORDER — SERTRALINE HCL 50 MG PO TABS: 50.0000 mg | ORAL_TABLET | Freq: Every day | ORAL | Status: DC

## 2012-12-28 NOTE — Telephone Encounter (Signed)
I re sent the  zoloft to her pharmacy since she never called back

## 2013-01-11 ENCOUNTER — Encounter: Payer: Self-pay | Admitting: *Deleted

## 2013-01-17 ENCOUNTER — Ambulatory Visit: Payer: Self-pay

## 2013-01-17 ENCOUNTER — Encounter: Payer: Self-pay | Admitting: Obstetrics and Gynecology

## 2013-02-11 ENCOUNTER — Telehealth: Payer: Self-pay | Admitting: Internal Medicine

## 2013-02-11 DIAGNOSIS — R5383 Other fatigue: Secondary | ICD-10-CM

## 2013-02-11 DIAGNOSIS — E559 Vitamin D deficiency, unspecified: Secondary | ICD-10-CM

## 2013-02-11 DIAGNOSIS — E785 Hyperlipidemia, unspecified: Secondary | ICD-10-CM

## 2013-02-11 DIAGNOSIS — R5381 Other malaise: Secondary | ICD-10-CM

## 2013-02-11 NOTE — Telephone Encounter (Signed)
I do not check labs on patients at their request.  If another doctor checked it and it was low,  They need to follow up on it.

## 2013-02-11 NOTE — Telephone Encounter (Signed)
Is this ok to order? 

## 2013-02-11 NOTE — Telephone Encounter (Signed)
Pt would like to have T3 and T4 tested.  States she has had T3 tested by another doctor and result was low.  Would like specifically to have these tests ordered.   Appt scheduled Friday 1/30, please order.

## 2013-02-12 NOTE — Telephone Encounter (Signed)
Yes, and I will be happy to add a TSH , which is a better indicator of thyroid function

## 2013-02-12 NOTE — Telephone Encounter (Signed)
Pt has an appt on 1/30 for lab.  She wants to know if she can get just the basic annual physical labs and come in fasting

## 2013-02-12 NOTE — Telephone Encounter (Signed)
The doctor is in [place]

## 2013-02-12 NOTE — Telephone Encounter (Signed)
Doesn't matter. I would not treat someone with a low T3 if the TSH is normal because T3 is unstable and has not used to guide therapy.

## 2013-02-13 ENCOUNTER — Encounter: Payer: Self-pay | Admitting: Internal Medicine

## 2013-02-13 DIAGNOSIS — R5381 Other malaise: Secondary | ICD-10-CM

## 2013-02-13 DIAGNOSIS — R5383 Other fatigue: Principal | ICD-10-CM

## 2013-02-13 NOTE — Telephone Encounter (Signed)
Pt aware, she will discuss the T3 with Dr Derrel Nip at her office visit

## 2013-02-15 ENCOUNTER — Other Ambulatory Visit: Payer: BC Managed Care – PPO

## 2013-02-19 ENCOUNTER — Other Ambulatory Visit: Payer: BC Managed Care – PPO

## 2013-02-25 ENCOUNTER — Ambulatory Visit: Payer: BC Managed Care – PPO | Admitting: Internal Medicine

## 2013-05-22 ENCOUNTER — Ambulatory Visit: Payer: Self-pay | Admitting: Internal Medicine

## 2013-06-03 ENCOUNTER — Ambulatory Visit: Payer: Self-pay

## 2013-06-03 LAB — FERRITIN: Ferritin (ARMC): 21 ng/mL (ref 8–388)

## 2013-06-03 LAB — IRON AND TIBC
Iron Bind.Cap.(Total): 482 ug/dL — ABNORMAL HIGH (ref 250–450)
Iron Saturation: 20 %
Iron: 98 ug/dL (ref 50–170)
UNBOUND IRON-BIND. CAP.: 384 ug/dL

## 2013-06-03 LAB — CBC CANCER CENTER
BASOS PCT: 0.9 %
Basophil #: 0.1 x10 3/mm (ref 0.0–0.1)
EOS ABS: 0.1 x10 3/mm (ref 0.0–0.7)
Eosinophil %: 1.6 %
HCT: 42.7 % (ref 35.0–47.0)
HGB: 14.5 g/dL (ref 12.0–16.0)
LYMPHS PCT: 29.5 %
Lymphocyte #: 2 x10 3/mm (ref 1.0–3.6)
MCH: 31.8 pg (ref 26.0–34.0)
MCHC: 34.1 g/dL (ref 32.0–36.0)
MCV: 93 fL (ref 80–100)
MONO ABS: 0.5 x10 3/mm (ref 0.2–0.9)
Monocyte %: 7.9 %
Neutrophil #: 4.1 x10 3/mm (ref 1.4–6.5)
Neutrophil %: 60.1 %
PLATELETS: 231 x10 3/mm (ref 150–440)
RBC: 4.58 10*6/uL (ref 3.80–5.20)
RDW: 12.8 % (ref 11.5–14.5)
WBC: 6.8 x10 3/mm (ref 3.6–11.0)

## 2013-06-17 ENCOUNTER — Ambulatory Visit: Payer: Self-pay

## 2013-06-17 ENCOUNTER — Ambulatory Visit: Payer: Self-pay | Admitting: Internal Medicine

## 2013-06-26 ENCOUNTER — Telehealth: Payer: Self-pay | Admitting: Internal Medicine

## 2013-06-26 NOTE — Telephone Encounter (Signed)
Phone is busy I will continue to try and reach the patient  

## 2013-06-27 NOTE — Telephone Encounter (Signed)
Questions about banding procedure answered.  She will call back if she has any additional questions or concerns

## 2013-06-27 NOTE — Telephone Encounter (Signed)
Left message for patient to call back  

## 2013-08-05 DIAGNOSIS — K643 Fourth degree hemorrhoids: Secondary | ICD-10-CM | POA: Insufficient documentation

## 2013-10-20 ENCOUNTER — Encounter: Payer: Self-pay | Admitting: Internal Medicine

## 2014-11-06 DIAGNOSIS — J342 Deviated nasal septum: Secondary | ICD-10-CM | POA: Insufficient documentation

## 2015-03-13 ENCOUNTER — Encounter: Payer: Self-pay | Admitting: *Deleted

## 2015-04-01 ENCOUNTER — Ambulatory Visit (INDEPENDENT_AMBULATORY_CARE_PROVIDER_SITE_OTHER): Payer: BLUE CROSS/BLUE SHIELD | Admitting: Internal Medicine

## 2015-04-01 ENCOUNTER — Encounter: Payer: Self-pay | Admitting: Internal Medicine

## 2015-04-01 VITALS — BP 108/68 | HR 70 | Temp 97.8°F | Resp 12 | Ht 63.0 in | Wt 126.5 lb

## 2015-04-01 DIAGNOSIS — E559 Vitamin D deficiency, unspecified: Secondary | ICD-10-CM

## 2015-04-01 DIAGNOSIS — Z299 Encounter for prophylactic measures, unspecified: Secondary | ICD-10-CM

## 2015-04-01 DIAGNOSIS — Z0000 Encounter for general adult medical examination without abnormal findings: Secondary | ICD-10-CM | POA: Diagnosis not present

## 2015-04-01 DIAGNOSIS — Z23 Encounter for immunization: Secondary | ICD-10-CM | POA: Diagnosis not present

## 2015-04-01 DIAGNOSIS — N959 Unspecified menopausal and perimenopausal disorder: Secondary | ICD-10-CM

## 2015-04-01 DIAGNOSIS — E785 Hyperlipidemia, unspecified: Secondary | ICD-10-CM

## 2015-04-01 DIAGNOSIS — R7989 Other specified abnormal findings of blood chemistry: Secondary | ICD-10-CM

## 2015-04-01 DIAGNOSIS — Z113 Encounter for screening for infections with a predominantly sexual mode of transmission: Secondary | ICD-10-CM

## 2015-04-01 DIAGNOSIS — Z862 Personal history of diseases of the blood and blood-forming organs and certain disorders involving the immune mechanism: Secondary | ICD-10-CM

## 2015-04-01 NOTE — Progress Notes (Signed)
Pre-visit discussion using our clinic review tool. No additional management support is needed unless otherwise documented below in the visit note.  

## 2015-04-01 NOTE — Progress Notes (Signed)
Patient ID: Kaylee Gentry, female    DOB: March 11, 1960  Age: 55 y.o. MRN: [id number]  The patient is here  with several concerns:   Has not been seen since 2014, formerly Raquel's patient  Last mammogram 2016 normal  , [facility] Last PAP Spring 2016 normal Last colonosocopy 2014 [facility]  normal ,  Had repeat done due to hemorrhoids several months later and a Polyp found  Sees dermatology [facility]    Dry eye  High fiber diet.   Takes 1000 Ius D3 daily , level has never been higher than  31, 32  Fasting labs to bet set up.  Requesint a full thyroid panel due to [place] of nderactive  Cbc with ferritin  TIBC panel  Has a gyn appt. Next week with [place] [facility]  On transdermal estrogen and oral progesterone  checked prior to wednesday GYN exam Would like [facility]  Wants referral to ENT docs who do turbinate reduction in the office,  Not in OR. (not [facility] due to prior experience)  Margaretha Sheffield? In [place]   discussed referral to GYN Garwin Brothers, Sharlett Iles)  Want to have hysterectomy without antibiotics  Will need to send message  To  Dr Garwin Brothers.     Had  GB removed in 2013 without abx  At [facility] by Dr Narda Bonds     The risk factors are reflected in the social history.  The roster of all physicians providing medical care to patient - is listed in the Snapshot section of the chart.  Activities of daily living:  The patient is 100% independent in all ADLs: dressing, toileting, feeding as well as independent mobility  Home safety : The patient has smoke detectors in the home. They wear seatbelts.  There are no firearms at home. There is no violence in the home.   There is no risks for hepatitis, STDs or HIV. There is no   history of blood transfusion. They have no travel history to infectious disease endemic areas of the world.  The patient has seen their dentist in the last six month. They have seen their eye doctor in the last year. They admit to slight hearing difficulty with regard to whispered voices and some  television programs.  They have deferred audiologic testing in the last year.  They do not  have excessive sun exposure. Discussed the need for sun protection: hats, long sleeves and use of sunscreen if there is significant sun exposure.   Diet: the importance of a healthy diet is discussed. They do have a healthy diet.  The benefits of regular aerobic exercise were discussed. She walks 4 times per week ,  20 minutes.   Depression screen: there are no signs or vegative symptoms of depression- irritability, change in appetite, anhedonia, sadness/tearfullness.  Cognitive assessment: the patient manages all their financial and personal affairs and is actively engaged. They could relate day,date,year and events; recalled 2/3 objects at 3 minutes; performed clock-face test normally.  The following portions of the patient's history were reviewed and updated as appropriate: allergies, current medications, past family history, past medical history,  past surgical history, past social history  and problem list.  Visual acuity was not assessed per patient preference since she has regular follow up with her ophthalmologist. Hearing and body mass index were assessed and reviewed.   During the course of the visit the patient was educated and counseled about appropriate screening and preventive services including : fall prevention , diabetes screening, nutrition counseling, colorectal cancer screening,  and recommended immunizations.    CC: There were no encounter diagnoses.  History Shealy has a past medical history of Allergy and GERD (gastroesophageal reflux disease).   She has past surgical history that includes Eye surgery; Shoulder surgery; Upper gastrointestinal endoscopy; and Colonoscopy (05/01/2009).   Her family history includes Cancer in her father; Hypertension in her maternal uncle; Hypothyroidism in her maternal uncle, mother, and sister.She reports that she has never smoked. She has never used  smokeless tobacco. She reports that she does not drink alcohol or use illicit drugs.  Outpatient Prescriptions Prior to Visit  Medication Sig Dispense Refill  . cholecalciferol (VITAMIN D) 400 UNITS TABS Take 400 Units by mouth daily.    . fexofenadine (ALLEGRA) 180 MG tablet Take 180 mg by mouth daily.    . Lactase 3000 UNITS CHEW Chew 1 tablet by mouth daily.    . Misc Natural Products (APPLE CIDER VINEGAR DIET PO) Take by mouth.    . Multiple Vitamins-Minerals (MULTIVITAMIN WITH MINERALS) tablet Take 1 tablet by mouth daily.    Marland Kitchen PANCREATIN PO Take 1 tablet by mouth daily.    Marland Kitchen acetaminophen (TYLENOL) 500 MG tablet Take 500 mg by mouth every 6 (six) hours as needed for pain. Reported on 04/01/2015    . ibuprofen (ADVIL,MOTRIN) 100 MG chewable tablet Chew 100 mg by mouth every 8 (eight) hours as needed for fever. Reported on 04/01/2015    . magnesium 30 MG tablet Take 30 mg by mouth. Reported on 04/01/2015    . nystatin cream (MYCOSTATIN) Apply 1 application topically 2 (two) times daily. (Patient not taking: Reported on 04/01/2015) 30 g 0  . sertraline (ZOLOFT) 50 MG tablet Take 1 tablet (50 mg total) by mouth daily. 1/2 tablet daily for one week, then increase to full tablet (Patient not taking: Reported on 04/01/2015) 30 tablet 3  . Tetrahydrozoline-Zn Sulfate (EYE DROPS A/C OP) Apply 1 drop to eye 4 (four) times daily - after meals and at bedtime. Reported on 04/01/2015     No facility-administered medications prior to visit.    Review of Systems   Patient denies headache, fevers, malaise, unintentional weight loss, skin rash, eye pain, sinus congestion and sinus pain, sore throat, dysphagia,  hemoptysis , cough, dyspnea, wheezing, chest pain, palpitations, orthopnea, edema, abdominal pain, nausea, melena, diarrhea, constipation, flank pain, dysuria, hematuria, urinary  Frequency, nocturia, numbness, tingling, seizures,  Focal weakness, Loss of consciousness,  Tremor, insomnia, depression,  anxiety, and suicidal ideation.     Objective:  BP 108/68 mmHg  Pulse 70  Temp(Src) 97.8 F (36.6 C) (Oral)  Resp 12  Ht 5\' 3"  (1.6 m)  Wt 126 lb 8 oz (57.38 kg)  BMI 22.41 kg/m2  SpO2 98%  LMP 02/25/2015  Physical Exam   General appearance: alert, cooperative and appears stated age Ears: normal TM's and external ear canals both ears Throat: lips, mucosa, and tongue normal; teeth and gums normal Neck: no adenopathy, no carotid bruit, supple, symmetrical, trachea midline and thyroid not enlarged, symmetric, no tenderness/mass/nodules Back: symmetric, no curvature. ROM normal. No CVA tenderness. Lungs: clear to auscultation bilaterally Heart: regular rate and rhythm, S1, S2 normal, no murmur, click, rub or gallop Abdomen: soft, non-tender; bowel sounds normal; no masses,  no organomegaly Pulses: 2+ and symmetric Skin: Skin color, texture, turgor normal. No rashes or lesions Lymph nodes: Cervical, supraclavicular, and axillary nodes normal.    Assessment & Plan:   Problem List Items Addressed This Visit    None  I am having Ms. Carli maintain her Lactase, ibuprofen, acetaminophen, magnesium, PANCREATIN PO, Misc Natural Products (APPLE CIDER VINEGAR DIET PO), Tetrahydrozoline-Zn Sulfate (EYE DROPS A/C OP), multivitamin with minerals, cholecalciferol, fexofenadine, nystatin cream, sertraline, estradiol, and progesterone.  Meds ordered this encounter  Medications  . estradiol (VIVELLE-DOT) 0.025 MG/24HR    Sig: Place onto the skin.  . progesterone (PROMETRIUM) 100 MG capsule    Sig:     There are no discontinued medications.  Follow-up: No Follow-up on file.   Crecencio Mc, MD

## 2015-04-01 NOTE — Patient Instructions (Addendum)

## 2015-04-02 ENCOUNTER — Other Ambulatory Visit (INDEPENDENT_AMBULATORY_CARE_PROVIDER_SITE_OTHER): Payer: BLUE CROSS/BLUE SHIELD

## 2015-04-02 DIAGNOSIS — Z862 Personal history of diseases of the blood and blood-forming organs and certain disorders involving the immune mechanism: Secondary | ICD-10-CM

## 2015-04-02 DIAGNOSIS — R7989 Other specified abnormal findings of blood chemistry: Secondary | ICD-10-CM

## 2015-04-02 DIAGNOSIS — R946 Abnormal results of thyroid function studies: Secondary | ICD-10-CM

## 2015-04-02 DIAGNOSIS — E785 Hyperlipidemia, unspecified: Secondary | ICD-10-CM

## 2015-04-02 DIAGNOSIS — E559 Vitamin D deficiency, unspecified: Secondary | ICD-10-CM | POA: Diagnosis not present

## 2015-04-02 DIAGNOSIS — Z113 Encounter for screening for infections with a predominantly sexual mode of transmission: Secondary | ICD-10-CM

## 2015-04-02 DIAGNOSIS — N959 Unspecified menopausal and perimenopausal disorder: Secondary | ICD-10-CM | POA: Diagnosis not present

## 2015-04-02 LAB — CBC WITH DIFFERENTIAL/PLATELET
BASOS ABS: 0 10*3/uL (ref 0.0–0.1)
BASOS PCT: 0.4 % (ref 0.0–3.0)
EOS ABS: 0.1 10*3/uL (ref 0.0–0.7)
Eosinophils Relative: 1.9 % (ref 0.0–5.0)
HEMATOCRIT: 41.6 % (ref 36.0–46.0)
HEMOGLOBIN: 13.9 g/dL (ref 12.0–15.0)
LYMPHS PCT: 27.2 % (ref 12.0–46.0)
Lymphs Abs: 1.7 10*3/uL (ref 0.7–4.0)
MCHC: 33.4 g/dL (ref 30.0–36.0)
MCV: 92.7 fl (ref 78.0–100.0)
Monocytes Absolute: 0.4 10*3/uL (ref 0.1–1.0)
Monocytes Relative: 6.7 % (ref 3.0–12.0)
Neutro Abs: 4.1 10*3/uL (ref 1.4–7.7)
Neutrophils Relative %: 63.8 % (ref 43.0–77.0)
Platelets: 211 10*3/uL (ref 150.0–400.0)
RBC: 4.48 Mil/uL (ref 3.87–5.11)
RDW: 13.1 % (ref 11.5–15.5)
WBC: 6.4 10*3/uL (ref 4.0–10.5)

## 2015-04-02 LAB — COMPREHENSIVE METABOLIC PANEL
ALBUMIN: 4.1 g/dL (ref 3.5–5.2)
ALK PHOS: 56 U/L (ref 39–117)
ALT: 15 U/L (ref 0–35)
AST: 16 U/L (ref 0–37)
BUN: 9 mg/dL (ref 6–23)
CALCIUM: 9.5 mg/dL (ref 8.4–10.5)
CO2: 31 mEq/L (ref 19–32)
Chloride: 103 mEq/L (ref 96–112)
Creatinine, Ser: 0.81 mg/dL (ref 0.40–1.20)
GFR: 78.82 mL/min (ref >60.00)
Glucose, Bld: 90 mg/dL (ref 70–99)
POTASSIUM: 4.1 meq/L (ref 3.5–5.1)
SODIUM: 139 meq/L (ref 135–145)
TOTAL PROTEIN: 6.6 g/dL (ref 6.0–8.3)
Total Bilirubin: 0.7 mg/dL (ref 0.2–1.2)

## 2015-04-02 LAB — LIPID PANEL
CHOL/HDL RATIO: 3
CHOLESTEROL: 186 mg/dL (ref 0–200)
HDL: 67.3 mg/dL (ref >39.00)
LDL CALC: 101 mg/dL — AB (ref 0–99)
NonHDL: 118.95
Triglycerides: 90 mg/dL (ref 0.0–149.0)
VLDL: 18 mg/dL (ref 0.0–40.0)

## 2015-04-02 LAB — VITAMIN D 25 HYDROXY (VIT D DEFICIENCY, FRACTURES): VITD: 32.46 ng/mL (ref 30.00–100.00)

## 2015-04-02 LAB — FOLLICLE STIMULATING HORMONE: FSH: 30.1 m[IU]/mL

## 2015-04-02 LAB — TSH: TSH: 2.34 u[IU]/mL (ref 0.35–4.50)

## 2015-04-02 LAB — LUTEINIZING HORMONE: LH: 22.02 m[IU]/mL

## 2015-04-02 LAB — LDL CHOLESTEROL, DIRECT: Direct LDL: 100 mg/dL

## 2015-04-02 LAB — T4, FREE: FREE T4: 0.72 ng/dL (ref 0.60–1.60)

## 2015-04-02 LAB — FERRITIN: FERRITIN: 37.1 ng/mL (ref 10.0–291.0)

## 2015-04-03 LAB — HIV ANTIBODY (ROUTINE TESTING W REFLEX): HIV: NONREACTIVE

## 2015-04-03 LAB — IRON AND TIBC
%SAT: 34 % (ref 11–50)
IRON: 131 ug/dL (ref 45–160)
TIBC: 389 ug/dL (ref 250–450)
UIBC: 258 ug/dL (ref 125–400)

## 2015-04-03 LAB — HEPATITIS C ANTIBODY: HCV AB: NEGATIVE

## 2015-04-04 ENCOUNTER — Encounter: Payer: Self-pay | Admitting: Internal Medicine

## 2015-04-05 DIAGNOSIS — Z299 Encounter for prophylactic measures, unspecified: Secondary | ICD-10-CM | POA: Insufficient documentation

## 2015-04-05 NOTE — Assessment & Plan Note (Signed)
Annual comprehensive preventive exam was done as well as an evaluation and management of chronic conditions .  During the course of the visit the patient was educated and counseled about appropriate screening and preventive services including :  diabetes screening, lipid analysis with projected  10 year  risk for CAD , nutrition counseling, breast, cervical and colorectal cancer screening, and recommended immunizations.  Printed recommendations for health maintenance screenings was give 

## 2015-04-08 ENCOUNTER — Encounter: Payer: Self-pay | Admitting: Internal Medicine

## 2015-04-12 ENCOUNTER — Encounter: Payer: Self-pay | Admitting: Internal Medicine

## 2015-04-13 ENCOUNTER — Telehealth: Payer: Self-pay | Admitting: Internal Medicine

## 2015-04-13 NOTE — Telephone Encounter (Signed)
Dr. Loletha Grayer,  Do you are any of your colleagues perform hysterectomies without perioperative antibiotics? Patient preference, given multiple drug intolerances (chart attached)

## 2015-04-15 NOTE — Telephone Encounter (Signed)
My Chart message sent

## 2015-04-17 NOTE — Telephone Encounter (Signed)
The only ID doctor at [facility] is Adrian Prows

## 2015-05-24 ENCOUNTER — Encounter: Payer: Self-pay | Admitting: Internal Medicine

## 2015-05-25 ENCOUNTER — Other Ambulatory Visit: Payer: Self-pay | Admitting: Internal Medicine

## 2015-05-25 DIAGNOSIS — Z01818 Encounter for other preprocedural examination: Secondary | ICD-10-CM

## 2015-05-27 DIAGNOSIS — J3489 Other specified disorders of nose and nasal sinuses: Secondary | ICD-10-CM | POA: Insufficient documentation

## 2015-05-27 DIAGNOSIS — J309 Allergic rhinitis, unspecified: Secondary | ICD-10-CM | POA: Insufficient documentation

## 2015-05-27 DIAGNOSIS — R6889 Other general symptoms and signs: Secondary | ICD-10-CM | POA: Insufficient documentation

## 2015-06-08 ENCOUNTER — Encounter: Payer: Self-pay | Admitting: Internal Medicine

## 2015-10-22 HISTORY — PX: VAGINAL HYSTERECTOMY: SUR661

## 2015-11-05 ENCOUNTER — Telehealth: Payer: Self-pay | Admitting: Internal Medicine

## 2015-11-05 NOTE — Telephone Encounter (Signed)
Pt called and was looking for a status up date on a bill in contested.   Call pt @ [phone] you may leave a detail message on answer machine.

## 2015-12-18 ENCOUNTER — Encounter: Payer: Self-pay | Admitting: Physical Therapy

## 2015-12-18 ENCOUNTER — Ambulatory Visit: Payer: BLUE CROSS/BLUE SHIELD | Attending: Obstetrics and Gynecology | Admitting: Physical Therapy

## 2015-12-18 DIAGNOSIS — R2689 Other abnormalities of gait and mobility: Secondary | ICD-10-CM | POA: Diagnosis present

## 2015-12-18 DIAGNOSIS — R278 Other lack of coordination: Secondary | ICD-10-CM | POA: Insufficient documentation

## 2015-12-18 NOTE — Therapy (Addendum)
[place] MAIN [facility] SERVICES [address] [place], [place], [address] Phone: [phone]   Fax:  [phone]  Physical Therapy Evaluation  Patient Details  Name: Kaylee Gentry MRN: [id number] Date of Birth: 08/07/1960 Referring Provider: Lanelle Bal   Encounter Date: 12/18/2015      PT End of Session - 12/24/15 2306    Visit Number 1   Number of Visits 12   Date for PT Re-Evaluation 03/11/16   PT Start Time 1100   PT Stop Time 1230   PT Time Calculation (min) 90 min   Activity Tolerance Patient tolerated treatment well;No increased pain   Behavior During Therapy WFL for tasks assessed/performed      Past Medical History:  Diagnosis Date  . Allergy   . GERD (gastroesophageal reflux disease)     Past Surgical History:  Procedure Laterality Date  . CHOLECYSTECTOMY  2014  . COLONOSCOPY  05/01/2009   normal  . EYE SURGERY     iridotomy; bilateral  . SHOULDER SURGERY     right  . UPPER GASTROINTESTINAL ENDOSCOPY    . VAGINAL HYSTERECTOMY  10/22/2015    There were no vitals filed for this visit.       Subjective Assessment - 12/24/15 2305    Subjective 1) pelvic pain: Pt underwent a vaginal hysterectomy 8 weeks ago.  t is currently feeling mm spasms in her inner thighs B and vaginal area located on L > R as she points to the pubic symphysis, and she feels like the muscles are tight like she is wearing a tampon. Pain level 5/10. Nothing relieves the pain.  Pain limits sitting for > 1 min and laying on back/ side, and unable to sleep well. Pt wakes up with muscle spasms. Prior to hsyterectomy, pt exercised alot with tennis, biking, running, some weights, crunches/ sit-ups ( 100 opp elbow to knee and 60 reps of various types). Pt noticed she over did with use of a foam roller to release her glut tightness and she felt worse.    2) urinary dysfunction: Pt is not emptying her bladder completely 80% of the time. and has frequent trips to the  toilet once every hour.    3)  bowel dysfunctions IBS symptoms:  urgency to get to the toilet and constipation (daily bowel movements : but with Stool Type 4 50% of the time). Marcelino Duster DVDs has been help ful for her digestion.       Pertinent History no pregnancies. Hx of gallbladder removal and hysterectomy             [facility] PT Assessment - 12/24/15 1119      Assessment   Medical Diagnosis Muscle spasms    Referring Provider Schiff      Precautions   Precautions None     Restrictions   Weight Bearing Restrictions No     Balance Screen   Has the patient fallen in the past 6 months No     Observation/Other Assessments   Observations demo'd toilet posture with heels raised on floor    Other Surveys  --  PSQI  %,  NIH-CPSI  %,   COREFO  %      Coordination   Gross Motor Movements are Fluid and Coordinated --  limited diaphragmatic excursion   Fine Motor Movements are Fluid and Coordinated --  limited pelvic floor ROM, abdominal straining w/ cue for BM     Other:   Other/ Comments childs pose ROM  limited to table top      AROM   Overall AROM Comments L sidebend 30% with reproduction of pelvic pain, R full ROM R sidebend, R ortation < L      Flexibility   Soft Tissue Assessment /Muscle Length --  hip abd/ER feet together: medial tibial plateau to table 34      Palpation   Spinal mobility intercostal mm with increased tensions with limited diaphragmatic excursion, midback mm tensions increased mm tensions   Palpation comment tenderness at L pubic tubercle.  L adductor and ischiocavernosus with increased tensions and tenderness > L,                     [facility] Adult PT Treatment/Exercise - 12/24/15 2303      Therapeutic Activites    Therapeutic Activities --  see pt instructions     Manual Therapy   Manual therapy comments thoracic PA mobs Grade ii-III in sidelying, STM at midback/ paraspinal mm, STM at ischiocavernosus                  PT  Education - 12/24/15 2304    Education provided Yes   Education Details POC, anatomy, physiology, goals    Person(s) Educated Patient   Methods Explanation;Demonstration;Tactile cues;Verbal cues;Handout             PT Long Term Goals - 12/24/15 2306      PT LONG TERM GOAL #1   Title Pt will report improved completely empty her urine 100% of the time instead 80% of the time in order to maintain healthy urinary system and minimize UTIs   Time 12   Period Weeks   Status New     PT LONG TERM GOAL #2   Title Pt will report urinating once every 2 hours instead of every  hour in order to restore urinary function.      Time 12   Period Weeks   Status New     PT LONG TERM GOAL #3   Title Pt will be able to  delay getting to the toilet when she has the urge to defecate with improved sensory awareness of rectal fullness in order to participate in more morning activities outside the homes   Time 12   Period Weeks   Status New     PT LONG TERM GOAL #4   Title Pt will demo decreased tenderness and tensions with pelvic floor muscles across 2 visits in order to decrease pain and to sit without her cushion   Time 12   Period Weeks   Status New     PT LONG TERM GOAL #5   Title Pt will be able to sit without a cushion > 30 min in order to sit for meals and social activities.    Time 12   Period Weeks   Status New     PT LONG TERM GOAL #6   Title Pt will be IND with modifications to fitness routien to minimize relapse of Sx    Time 12   Period Weeks   Status New     PT LONG TERM GOAL #7   Title Pt will have a change in scores by 5% to indicate greater function on PSQI  67%,  NIH-CPSI  86%,   VSQ 57%   in order to improve QOL   Time 12   Period Weeks   Status New  Plan - 12/24/15 2306    Clinical Impression Statement Pt is a 55 yo female who c/o pelvic pain and mm spasms ,difficulty to completely urinate, and IBS-related constipation. These deficits impact  her ability to sit without a cushion, completely eliminate urinate, delay getting to the toilet, and her stool consistency. Pt 's clinical presentation showed an overactive pelvic floor, poor dyscoordination of deep core mm, poor understanding of fitness exercises for optimal abdominopelvic health, and poor body mechanics with funcitonal activities, and lack of relaxation skills. Following Tx today, pt demo'd decreased pelvic floor and spinal mm tensions, increased thoracic mobility, and improved deep core coordination .     Rehab Potential Good   PT Frequency 1x / week   PT Duration 12 weeks   PT Treatment/Interventions ADLs/Self Care Home Management;Aquatic Therapy;Cryotherapy;Therapeutic exercise;Therapeutic activities;Functional mobility training;Moist Heat;Neuromuscular re-education;Stair training;Patient/family education;Balance training;Manual techniques;Energy conservation;Taping   Consulted and Agree with Plan of Care Patient      Patient will benefit from skilled therapeutic intervention in order to improve the following deficits and impairments:  Pain, Improper body mechanics, Decreased coordination, Decreased mobility, Increased muscle spasms, Postural dysfunction, Decreased endurance, Decreased range of motion, Decreased strength, Hypomobility, Decreased balance, Decreased safety awareness, Impaired flexibility  Visit Diagnosis: Other abnormalities of gait and mobility  Other lack of coordination     Problem List Patient Active Problem List   Diagnosis Date Noted  . Encounter for preventive measure 04/05/2015  . Screening for osteoporosis 08/12/2012  . 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 [place], [place], [address] Phone: [phone]   Fax:  [phone]  Name: Kaylee Gentry MRN: [id number] Date of Birth:  December 26, 1960

## 2015-12-18 NOTE — Patient Instructions (Signed)
Flexibility : Open book ( handout)  15 reps both sides      Qi gong breathing  = Reverse kegel  You are now ready to begin training the deep core muscles system: diaphragm, transverse abdominis, pelvic floor . These muscles must work together as a team.         The key to these exercises to train the brain to coordinate the timing of these muscles and to have them turn on for long periods of time to hold you upright against gravity (especially important if you are on your feet all day).These muscles are postural muscles and play a role stabilizing your spine and bodyweight. By doing these repetitions slowly and correctly instead of doing crunches, you will achieve a flatter belly without a lower pooch. You are also placing your spine in a more neutral position and breathing properly which in turn, decreases your risk for problems related to your pelvic floor, abdominal, and low back such as pelvic organ prolapse, hernias, diastasis recti (separation of superficial muscles), disk herniations, spinal fractures. These exercises set a solid foundation for you to later progress to resistance/ strength training with therabands and weights and return to other typical fitness exercises with a stronger deeper core.  Laying on back with pillows under knees to relax inner thighs (Butterfly pose)   Do level 1 with diaphragmatic expansion like tree rings  : 20 reps  X 2 day (pelvic floor will lengthen naturally and expand)     Flexibility #2 exercise  :  Child pose rocking  10 reps x 2 day     Standing mini squats w/ hand motions Ending with Marcelino Duster Stance to reinforce relaxation of pelvic floor

## 2015-12-24 ENCOUNTER — Ambulatory Visit: Payer: BLUE CROSS/BLUE SHIELD | Admitting: Physical Therapy

## 2015-12-24 DIAGNOSIS — R278 Other lack of coordination: Secondary | ICD-10-CM

## 2015-12-24 DIAGNOSIS — R2689 Other abnormalities of gait and mobility: Secondary | ICD-10-CM

## 2015-12-24 NOTE — Patient Instructions (Addendum)
Stretches for the buttocks:  Towel under thighs, elbows bent forearms perpendicular to bed  Figure -4  and cross over         Inhale,  Exhale , draw towel and legs towards chest     Stretches for the hip joint:  Belt under ballmounds, spread toes, keep knee straight but not locked, elbows bent forearms perpendicular to bed  Press ballmounds against belt while pressing upper arms and elbows gently into bed  Points of stability: opp foot, both back of hips, upper body   3 breaths   Then rock to the right and left 10-20 deg  with knee straight 5-10 x    Stretches for the hamstrings:  Belt under ballmounds, spread toes, elbows bent forearms perpendicular to bed  Knee cap pointed out slight, bend knee and straighten back and fort 5-10 reps slowly  Knee cap straight   Knee cap inward slightly    ____________________  Principles of stretching:  Notice points of stability

## 2015-12-24 NOTE — Addendum Note (Signed)
Addended by: Jerl Mina on: 12/24/2015 11:24 PM   Modules accepted: Orders

## 2015-12-25 NOTE — Therapy (Signed)
[place] MAIN [facility] SERVICES [address] [place], [place], [address] Phone: [phone]   Fax:  [phone]  Physical Therapy Treatment  Patient Details  Name: Kaylee Gentry MRN: [id number] Date of Birth: [birth year] Referring Provider: Lanelle Bal   Encounter Date: 12/24/2015      PT End of Session - 12/25/15 0009    Visit Number 2   Number of Visits 12   Date for PT Re-Evaluation 03/11/16   PT Start Time 1115   PT Stop Time 1230   PT Time Calculation (min) 75 min   Activity Tolerance Patient tolerated treatment well;No increased pain   Behavior During Therapy WFL for tasks assessed/performed      Past Medical History:  Diagnosis Date  . Allergy   . GERD (gastroesophageal reflux disease)     Past Surgical History:  Procedure Laterality Date  . CHOLECYSTECTOMY  2014  . COLONOSCOPY  05/01/2009   normal  . EYE SURGERY     iridotomy; bilateral  . SHOULDER SURGERY     right  . UPPER GASTROINTESTINAL ENDOSCOPY    . VAGINAL HYSTERECTOMY  10/22/2015    There were no vitals filed for this visit.      Subjective Assessment - 12/24/15 2349    Subjective Pt reports she feels her pelvic mm spasms decreased by 90% since last session and she feels the cramping in her legs have moved up from the calf level to the knee level. Pt state the pain at the pubic bone has decreased by 50%. Pt still finds it difficulty to practice the diaphragmatic breathing.    Pertinent History no pregnancies. Hx of gallbladder removal and hysterectomy             [facility] PT Assessment - 12/25/15 0002      Observation/Other Assessments   Observations improved flexibility in childs pose, poor stability and alignment in hands to shoulders, knees to hips     AROM   Overall AROM Comments L sidebend WFL without reproduction of pelvic pain, rotation [facility]     Flexibility   Soft Tissue Assessment /Muscle Length --  hip abd/ER feet together:medial tibial plateau  to table 24cm   Hamstrings B ~120 deg      Palpation   Spinal mobility intercostal mm with decreased tensions with increased diaphragmatic excursion, midback mm tensions increased mm tensions   Palpation comment less tenderness at L pubic tubercle.  L adductor and ischiocavernosus with decreased tensions and tenderness > L,                    Pelvic Floor Special Questions - 12/25/15 0012    Exam Type Deferred           [facility] PT Treatment/Exercise - 12/25/15 0007      Therapeutic Activites    Therapeutic Activities --  see pt instructions     Neuro Re-ed    Neuro Re-ed Details  alignment and stability cues, education on principles of stretches with stability points, principle of relaxation and nn system regulation      Manual Therapy   Manual therapy comments external palpation: STM at ischiovaernosus L/R  and MWM                PT Education - 12/25/15 0008    Education provided Yes   Education Details HEP   Person(s) Educated Patient   Methods Explanation;Demonstration;Tactile cues;Verbal cues;Handout   Comprehension Returned demonstration;Verbalized understanding  PT Long Term Goals - 12/24/15 2306      PT LONG TERM GOAL #1   Title Pt will report improved completely empty her urine 100% of the time instead 80% of the time in order to maintain healthy urinary system and minimize UTIs   Time 12   Period Weeks   Status New     PT LONG TERM GOAL #2   Title Pt will report urinating once every 2 hours instead of every  hour in order to restore urinary function.      Time 12   Period Weeks   Status New     PT LONG TERM GOAL #3   Title Pt will be able to  delay getting to the toilet when she has the urge to defecate with improved sensory awareness of rectal fullness in order to participate in more morning activities outside the homes   Time 12   Period Weeks   Status New     PT LONG TERM GOAL #4   Title Pt will demo  decreased tenderness and tensions with pelvic floor muscles across 2 visits in order to decrease pain and to sit without her cushion   Time 12   Period Weeks   Status New     PT LONG TERM GOAL #5   Title Pt will be able to sit without a cushion > 30 min in order to sit for meals and social activities.    Time 12   Period Weeks   Status New     PT LONG TERM GOAL #6   Title Pt will be IND with modifications to fitness routien to minimize relapse of Sx    Time 12   Period Weeks   Status New     PT LONG TERM GOAL #7   Title Pt will have a change in scores by 5% to indicate greater function on PSQI  67%,  NIH-CPSI  86%,   VSQ 57%   in order to improve QOL   Time 12   Period Weeks   Status New               Plan - 12/25/15 0009    Clinical Impression Statement Pt reports her pelvic pain and mm spasms have decreased by 90% and pain at pubic bone has decreased by 50% at her 2nd visit today. Pt showed significantly decreased mm tensions at the pelvic floor,  adductor, and back. Pt also demo'd significantly increased spinal mobility and adductor flexibility in supine compared to last session.  Pt demo'd HEP correctly after cuing. New HEP was added to stretch LE due to limited hamstring flexibility.  Educated pt on principles of relaxation and nn system regulation as well as the principles of stretching. Pt voiced understanding. Pt continues to benefit from skilled PT.     Rehab Potential Good   PT Frequency 1x / week   PT Duration 12 weeks   PT Treatment/Interventions ADLs/Self Care Home Management;Aquatic Therapy;Cryotherapy;Therapeutic exercise;Therapeutic activities;Functional mobility training;Moist Heat;Neuromuscular re-education;Stair training;Patient/family education;Balance training;Manual techniques;Energy conservation;Taping   Consulted and Agree with Plan of Care Patient      Patient will benefit from skilled therapeutic intervention in order to improve the following  deficits and impairments:  Pain, Improper body mechanics, Decreased coordination, Decreased mobility, Increased muscle spasms, Postural dysfunction, Decreased endurance, Decreased range of motion, Decreased strength, Hypomobility, Decreased balance, Decreased safety awareness, Impaired flexibility  Visit Diagnosis: Other abnormalities of gait and mobility  Other lack of coordination  Problem List Patient Active Problem List   Diagnosis Date Noted  . Encounter for preventive measure 04/05/2015  . Screening for osteoporosis 08/12/2012  . 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

## 2015-12-28 ENCOUNTER — Ambulatory Visit: Payer: BLUE CROSS/BLUE SHIELD | Admitting: Physical Therapy

## 2015-12-28 DIAGNOSIS — R2689 Other abnormalities of gait and mobility: Secondary | ICD-10-CM | POA: Diagnosis not present

## 2015-12-28 DIAGNOSIS — R278 Other lack of coordination: Secondary | ICD-10-CM

## 2015-12-28 NOTE — Patient Instructions (Signed)
Happy baby pose with strap    Stairclimbing with feet placed hip width apart  Exercise: Side stepping (perpendicular to stairs) 10 steps L  Up/ 10 steps L down, 10 steps other side Scoot foot to ensure hip width apart

## 2015-12-28 NOTE — Therapy (Signed)
[place] MAIN [facility] SERVICES [address] [place], [place], [address] Phone: [phone]   Fax:  [phone]  Physical Therapy Treatment  Patient Details  Name: Kaylee Gentry MRN: [id number] Date of Birth: 13-Jun-1960 Referring Provider: Lanelle Bal   Encounter Date: 12/28/2015      PT End of Session - 12/28/15 1850    Visit Number 3   Number of Visits 12   Date for PT Re-Evaluation 03/11/16   PT Start Time 1000   PT Stop Time 1058   PT Time Calculation (min) 58 min   Activity Tolerance No increased pain;Patient tolerated treatment well   Behavior During Therapy [facility] for tasks assessed/performed      Past Medical History:  Diagnosis Date  . Allergy   . GERD (gastroesophageal reflux disease)     Past Surgical History:  Procedure Laterality Date  . CHOLECYSTECTOMY  2014  . COLONOSCOPY  05/01/2009   normal  . EYE SURGERY     iridotomy; bilateral  . SHOULDER SURGERY     right  . UPPER GASTROINTESTINAL ENDOSCOPY    . VAGINAL HYSTERECTOMY  10/22/2015    There were no vitals filed for this visit.      Subjective Assessment - 12/28/15 1110    Subjective Pt reports she is able to urinate 40% of the time with less difficulty. Pt still feels she is not able to walk at her normal speed yet.    Pertinent History no pregnancies. Hx of gallbladder removal and hysterectomy             [facility] PT Assessment - 12/28/15 1833      Flexibility   Soft Tissue Assessment /Muscle Length --  hip abd/ER:med tibial plateau to table 24cm(preTx)21(postTx)   Hamstrings B 180 deg                  Pelvic Floor Special Questions - 12/28/15 1842    External Perineal Exam consented with undergarment donned    External Palpation increased fascial tensions along anterior triangle, ischiocavernosus L>R, and adductors B             [facility] Adult PT Treatment/Exercise - 12/28/15 1845      Therapeutic Activites    Therapeutic Activities --    see pt instructions     Manual Therapy   Manual therapy comments fascial releases over problem areas indicated in assessment                     PT Long Term Goals - 12/24/15 2306      PT LONG TERM GOAL #1   Title Pt will report improved completely empty her urine 100% of the time instead 80% of the time in order to maintain healthy urinary system and minimize UTIs   Time 12   Period Weeks   Status New     PT LONG TERM GOAL #2   Title Pt will report urinating once every 2 hours instead of every  hour in order to restore urinary function.      Time 12   Period Weeks   Status New     PT LONG TERM GOAL #3   Title Pt will be able to  delay getting to the toilet when she has the urge to defecate with improved sensory awareness of rectal fullness in order to participate in more morning activities outside the homes   Time 12   Period Weeks   Status New  PT LONG TERM GOAL #4   Title Pt will demo decreased tenderness and tensions with pelvic floor muscles across 2 visits in order to decrease pain and to sit without her cushion   Time 12   Period Weeks   Status New     PT LONG TERM GOAL #5   Title Pt will be able to sit without a cushion > 30 min in order to sit for meals and social activities.    Time 12   Period Weeks   Status New     PT LONG TERM GOAL #6   Title Pt will be IND with modifications to fitness routien to minimize relapse of Sx    Time 12   Period Weeks   Status New     PT LONG TERM GOAL #7   Title Pt will have a change in scores by 5% to indicate greater function on PSQI  67%,  NIH-CPSI  86%,   VSQ 57%   in order to improve QOL   Time 12   Period Weeks   Status New               Plan - 12/28/15 1857    Clinical Impression Statement Pt continues to show good progress with less antalgic gait speed and hamstring flexibility across the past visits. After external Tx today, pt achieved increased hip ER/abduction in supine from 25 cm to  21 cm bilaterally at lateral tibial plateau to table. Initiated Company secretary and modification today which pt demo'd correctly and tolerated  activity without complaint.   Pt continues to benefit from skilled PT.    Rehab Potential Good   PT Frequency 1x / week   PT Duration 12 weeks   PT Treatment/Interventions ADLs/Self Care Home Management;Aquatic Therapy;Cryotherapy;Therapeutic exercise;Therapeutic activities;Functional mobility training;Moist Heat;Neuromuscular re-education;Stair training;Patient/family education;Balance training;Manual techniques;Energy conservation;Taping   Consulted and Agree with Plan of Care Patient      Patient will benefit from skilled therapeutic intervention in order to improve the following deficits and impairments:  Pain, Improper body mechanics, Decreased coordination, Decreased mobility, Increased muscle spasms, Postural dysfunction, Decreased endurance, Decreased range of motion, Decreased strength, Hypomobility, Decreased balance, Decreased safety awareness, Impaired flexibility  Visit Diagnosis: Other abnormalities of gait and mobility  Other lack of coordination     Problem List Patient Active Problem List   Diagnosis Date Noted  . Encounter for preventive measure 04/05/2015  . Screening for osteoporosis 08/12/2012  . 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

## 2015-12-29 ENCOUNTER — Encounter: Payer: BLUE CROSS/BLUE SHIELD | Admitting: Physical Therapy

## 2015-12-29 ENCOUNTER — Ambulatory Visit: Payer: BLUE CROSS/BLUE SHIELD | Admitting: Physical Therapy

## 2015-12-29 DIAGNOSIS — R278 Other lack of coordination: Secondary | ICD-10-CM

## 2015-12-29 DIAGNOSIS — R2689 Other abnormalities of gait and mobility: Secondary | ICD-10-CM

## 2015-12-29 NOTE — Therapy (Signed)
[place] MAIN [facility] SERVICES [address] [place], [place], [address] Phone: [phone]   Fax:  [phone]  Physical Therapy Treatment  Patient Details  Name: Kaylee Gentry MRN: [id number] Date of Birth: 1960-09-30 Referring Provider: Lanelle Bal   Encounter Date: 12/29/2015      PT End of Session - 12/29/15 1616    Visit Number 4   Number of Visits 12   Date for PT Re-Evaluation 03/11/16   PT Start Time 1505   PT Stop Time 1605   PT Time Calculation (min) 60 min   Activity Tolerance No increased pain;Patient tolerated treatment well   Behavior During Therapy [facility] for tasks assessed/performed      Past Medical History:  Diagnosis Date  . Allergy   . GERD (gastroesophageal reflux disease)     Past Surgical History:  Procedure Laterality Date  . CHOLECYSTECTOMY  2014  . COLONOSCOPY  05/01/2009   normal  . EYE SURGERY     iridotomy; bilateral  . SHOULDER SURGERY     right  . UPPER GASTROINTESTINAL ENDOSCOPY    . VAGINAL HYSTERECTOMY  10/22/2015    There were no vitals filed for this visit.      Subjective Assessment - 12/29/15 1509    Subjective Pt reports she felt no increased pain after stairclimbing activity. Pt has been using her cushion 60% of the time. Whole body mm spasms have dissipated. remaining mm spasms occur only in the anterior thighs and in her stomach but they are smaller.     Pertinent History no pregnancies. Hx of gallbladder removal and hysterectomy             [facility] PT Assessment - 12/29/15 1600      Flexibility   Soft Tissue Assessment /Muscle Length --  hip abd/ER:med tibial plateau to table 17cm     Palpation   Palpation comment Pt tolerated 2-3rd layer externally with increased tensions R > L at deep transverse perineal , L > R bulbospongiosus , tenderness  noted only at R side                   Pelvic Floor Special Questions - 12/28/15 1842    External Perineal Exam consented  with undergarment donned    External Palpation increased fascial tensions along anterior triangle, ischiocavernosus L>R, and adductors B             [facility] Adult PT Treatment/Exercise - 12/29/15 1600      Therapeutic Activites    Therapeutic Activities --  pacing activities, edu on pain science, increase confidence      Neuro Re-ed    Neuro Re-ed Details  deep core level 2      Manual Therapy   Manual therapy comments fascial releases over problem areas indicated in assessment                PT Education - 12/29/15 1603    Education provided Yes   Education Details HEP   Person(s) Educated Patient   Methods Explanation;Demonstration;Tactile cues;Verbal cues;Handout   Comprehension Verbalized understanding;Returned demonstration             PT Long Term Goals - 12/29/15 1828      PT LONG TERM GOAL #1   Title Pt will report improved completely empty her urine 100% of the time instead 80% of the time in order to maintain healthy urinary system and minimize UTIs   Time 12   Period Weeks  Status On-going     PT LONG TERM GOAL #2   Title Pt will report urinating once every 2 hours instead of every  hour in order to restore urinary function.      Time 12   Period Weeks   Status On-going     PT LONG TERM GOAL #3   Title Pt will be able to  delay getting to the toilet when she has the urge to defecate with improved sensory awareness of rectal fullness in order to participate in more morning activities outside the homes   Time 12   Period Weeks   Status On-going     PT LONG TERM GOAL #4   Title Pt will demo decreased tenderness and tensions with pelvic floor muscles across 2 visits in order to decrease pain and to sit without her cushion   Time 12   Period Weeks   Status Partially Met     PT LONG TERM GOAL #5   Title Pt will be able to sit without a cushion > 30 min in order to sit for meals and social activities.    Time 12   Period Weeks   Status  On-going     Additional Long Term Goals   Additional Long Term Goals Yes     PT LONG TERM GOAL #6   Title Pt will be IND with modifications to fitness routien to minimize relapse of Sx    Time 12   Period Weeks   Status On-going     PT LONG TERM GOAL #7   Title Pt will have a change in scores by 5% to indicate greater function on PSQI  67%,  NIH-CPSI  86%,   VSQ 57%   in order to improve QOL   Time 12   Period Weeks   Status On-going     PT LONG TERM GOAL #8   Title Pt will demo ability to perform B hip ER/abduction with feet together with a distance of < 17 cm from lateral tibial plateau to bed across two visits in order to demo increased adductor flexibility and to be prepared for integration back to fitness activities.    Baseline 24 cm    Time 12   Period Weeks   Status New               Plan - 12/29/15 1616    Clinical Impression Statement Pt continues to make good progress and did not use her cushion today. Pt states she has uses it only 60% of the time. Pt showed continued flexibility of her B adductors and decreased fascial restrictions over pubic and perineal area. Pt tolerated deeper palpation w/ external manual treatments with increased ability to stay relaxed and not increase mm tensions. Progressed to deep core level 2 and anticipate this exercise will help address her pelvic floor and IBS-related goals. . Pt continues to show good carry over with proper deep core coordination. Required biopsychosocial approaches on progression/ integration towards fitness, technique to HEP, principles to relaxation, stretching and deep core exercises. Emphasized to not apply additional tightening of stomach muscles with breathing nor exercises and instead to allow breathing and deep core coordination to occur naturally. Pt voiced understanding. Pt continues to benefit from skilled PT.   Rehab Potential Good   PT Frequency 1x / week   PT Duration 12 weeks   PT  Treatment/Interventions ADLs/Self Care Home Management;Aquatic Therapy;Cryotherapy;Therapeutic exercise;Therapeutic activities;Functional mobility training;Moist Heat;Neuromuscular re-education;Stair training;Patient/family education;Balance training;Manual techniques;Energy conservation;Taping  Consulted and Agree with Plan of Care Patient      Patient will benefit from skilled therapeutic intervention in order to improve the following deficits and impairments:  Pain, Improper body mechanics, Decreased coordination, Decreased mobility, Increased muscle spasms, Postural dysfunction, Decreased endurance, Decreased range of motion, Decreased strength, Hypomobility, Decreased balance, Decreased safety awareness, Impaired flexibility  Visit Diagnosis: Other abnormalities of gait and mobility  Other lack of coordination     Problem List Patient Active Problem List   Diagnosis Date Noted  . Encounter for preventive measure 04/05/2015  . Screening for osteoporosis 08/12/2012  . 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

## 2015-12-29 NOTE — Patient Instructions (Addendum)
Add to glut stretches for hip abductor:  Figure -4    cross over stretch  ______________  Add to relaxation tool box:  Paced breathing 1-2- pause, 2-1, pause  To further relaxation: 1-2 pause, 4-3-2-1 pause   ______________  Kaylee Gentry are now ready to begin training the deep core muscles system: diaphragm, transverse abdominis, pelvic floor . These muscles must work together as a team.           The key to these exercises to train the brain to coordinate the timing of these muscles and to have them turn on for long periods of time to hold you upright against gravity (especially important if you are on your feet all day).These muscles are postural muscles and play a role stabilizing your spine and bodyweight. By doing these repetitions slowly and correctly instead of doing crunches, you will achieve a flatter belly without a lower pooch. You are also placing your spine in a more neutral position and breathing properly which in turn, decreases your risk for problems related to your pelvic floor, abdominal, and low back such as pelvic organ prolapse, hernias, diastasis recti (separation of superficial muscles), disk herniations, spinal fractures. These exercises set a solid foundation for you to later progress to resistance/ strength training with therabands and weights and return to other typical fitness exercises with a stronger deeper core.   Do level 1 : 10 reps Do level 2: 10 reps (left and right = 1 rep) x 3 sets , 2 x day Do not progress to level 3 for 3-4 weeks. You know you are ready when you do not have any rocking of pelvis nor arching in your back

## 2016-01-01 ENCOUNTER — Ambulatory Visit: Payer: BLUE CROSS/BLUE SHIELD | Admitting: Physical Therapy

## 2016-01-01 DIAGNOSIS — R2689 Other abnormalities of gait and mobility: Secondary | ICD-10-CM

## 2016-01-01 DIAGNOSIS — R278 Other lack of coordination: Secondary | ICD-10-CM

## 2016-01-01 NOTE — Patient Instructions (Addendum)
Body mecahnics to pick things up from the floor  Picking up light objects from floor: Golfers lift/ Low lunge Heavier object: mini squat exhale on rise    Body scan: technique  Daily 3 cycles    Deep core level 1 and 2 are done with pillow under hips  Deep core level 1     _ (with extra plucking squeeze ( milking a cow in reverse)      _  add counting with the breathing 1-2- pause, 2-1-pause to maximize expansion of diaphragm and pelvic floor   Deep core level 2

## 2016-01-01 NOTE — Therapy (Signed)
[place] MAIN [facility] SERVICES [address] [place], [place], [address] Phone: [phone]   Fax:  [phone]  Physical Therapy Treatment  Patient Details  Name: Kaylee Gentry MRN: [id number] Date of Birth: 20-Apr-1960 Referring Provider: Lanelle Bal   Encounter Date: 01/01/2016      PT End of Session - 01/01/16 1211    Visit Number 5   Number of Visits 12   Date for PT Re-Evaluation 03/11/16   PT Start Time 3354   PT Stop Time 1210   PT Time Calculation (min) 65 min   Activity Tolerance Patient tolerated treatment well;No increased pain   Behavior During Therapy WFL for tasks assessed/performed      Past Medical History:  Diagnosis Date  . Allergy   . GERD (gastroesophageal reflux disease)     Past Surgical History:  Procedure Laterality Date  . CHOLECYSTECTOMY  2014  . COLONOSCOPY  05/01/2009   normal  . EYE SURGERY     iridotomy; bilateral  . SHOULDER SURGERY     right  . UPPER GASTROINTESTINAL ENDOSCOPY    . VAGINAL HYSTERECTOMY  10/22/2015    There were no vitals filed for this visit.      Subjective Assessment - 01/01/16 1107    Subjective Pt reports she got impatient with stairclimbing exercise. Pt feels progress from last session. Pt felt some pain getting tot he floor and standing transitions.     Pertinent History no pregnancies. Hx of gallbladder removal and hysterectomy    Patient Stated Goals strengthen her core to avoid prolpase, get rid of the pain            [facility] PT Assessment - 01/01/16 1331      Coordination   Gross Motor Movements are Fluid and Coordinated --  improved diaphragmatic breathing   Fine Motor Movements are Fluid and Coordinated --  required paced cues to maximal pelvic floor lengthening      Palpation   Palpation comment Pt tolerated external palpation externally into iliococcygeus B and pt consented verablly for internal assessment      Ambulation/Gait   Gait Comments pre Tx:  0.24ms, post Tx: 0.9 m/s                   Pelvic Floor Special Questions - 01/01/16 1335    Prolapse Anterior Wall  without introitus    Pelvic Floor Internal Exam pt consented verbally with contraindication   Exam Type Vaginal   Palpation increased tensions at iliococcygeus and ischiococcygeus B    Strength fair squeeze, definite lift  pillow under hips, more cranial position of bladder            [facility] Adult PT Treatment/Exercise - 01/01/16 1331      Therapeutic Activites    Therapeutic Activities --  see pt instructions     Neuro Re-ed    Neuro Re-ed Details  see pt instructions     Manual Therapy   Manual therapy comments fascial releases internally ilio/ischiococcygeus B and lateral border of bladder and suprapubic area                PT Education - 01/01/16 1210    Education provided Yes   Education Details HEP   Person(s) Educated Patient   Methods Explanation;Demonstration;Tactile cues;Verbal cues;Handout   Comprehension Returned demonstration;Verbalized understanding             PT Long Term Goals - 01/01/16 1358  PT LONG TERM GOAL #1   Title Pt will report improved completely empty her urine 100% of the time instead 80% of the time in order to maintain healthy urinary system and minimize UTIs   Time 12   Period Weeks   Status On-going     PT LONG TERM GOAL #2   Title Pt will report urinating once every 2 hours instead of every  hour in order to restore urinary function.      Time 12   Period Weeks   Status On-going     PT LONG TERM GOAL #3   Title Pt will be able to  delay getting to the toilet when she has the urge to defecate with improved sensory awareness of rectal fullness in order to participate in more morning activities outside the homes   Time 12   Period Weeks   Status On-going     PT LONG TERM GOAL #4   Title Pt will demo decreased tenderness and tensions with pelvic floor muscles across 2 visits in order to  decrease pain and to sit without her cushion   Time 12   Period Weeks   Status Partially Met     PT LONG TERM GOAL #5   Title Pt will be able to sit without a cushion > 30 min in order to sit for meals and social activities.    Time 12   Period Weeks   Status On-going     Additional Long Term Goals   Additional Long Term Goals Yes     PT LONG TERM GOAL #6   Title Pt will be IND with modifications to fitness routien to minimize relapse of Sx    Time 12   Period Weeks   Status On-going     PT LONG TERM GOAL #7   Title Pt will have a change in scores by 5% to indicate greater function on PSQI  67%,  NIH-CPSI  86%,   VSQ 57%   in order to improve QOL   Time 12   Period Weeks   Status On-going     PT LONG TERM GOAL #8   Title Pt will demo ability to perform B hip ER/abduction with feet together with a distance of < 17 cm from lateral tibial plateau to bed across two visits in order to demo increased adductor flexibility and to be prepared for integration back to fitness activities.    Baseline 24 cm    Time 12   Period Weeks   Status New     PT LONG TERM GOAL  #9   TITLE Pt will increase her gait from 0.8 m/s to > 1.0 m/s in order to improve gait and walking   Time 12   Period Weeks   Status New     PT LONG TERM GOAL  #10   TITLE Pt will demo no need for a pillow under her hips to elicit a Grade 9/7/4/1 adnd demo a more cranial position of her bladder in order to return to fitness and have  less pelvic pain    Time 12   Period Weeks   Status New               Plan - 01/01/16 1211    Clinical Impression Statement Pt continues to decreased pelvic floor mm tensions. Internal pelvic floor assessment showed a descended bladder within the introitus. Following external and internal manual Tx and with hips elevated over a pillow, pt  showed a more cranial position of the bladder with more circumferential contraction of the pelvic floor mm and less pain with palpation in the  3rd layer of the pelvic floor mm. Pt 's gait speed increased by 0.59ms following Tx. Pt continues to require biopsychosocial approaches and mindfulness practice was initiated today. Pt is progressing towards her goals with skilled Pt.    Rehab Potential Good   PT Frequency 1x / week   PT Duration 12 weeks   PT Treatment/Interventions ADLs/Self Care Home Management;Aquatic Therapy;Cryotherapy;Therapeutic exercise;Therapeutic activities;Functional mobility training;Moist Heat;Neuromuscular re-education;Stair training;Patient/family education;Balance training;Manual techniques;Energy conservation;Taping   Consulted and Agree with Plan of Care Patient      Patient will benefit from skilled therapeutic intervention in order to improve the following deficits and impairments:  Pain, Improper body mechanics, Decreased coordination, Decreased mobility, Increased muscle spasms, Postural dysfunction, Decreased endurance, Decreased range of motion, Decreased strength, Hypomobility, Decreased balance, Decreased safety awareness, Impaired flexibility  Visit Diagnosis: Other abnormalities of gait and mobility  Other lack of coordination     Problem List Patient Active Problem List   Diagnosis Date Noted  . Encounter for preventive measure 04/05/2015  . Screening for osteoporosis 08/12/2012  . 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

## 2016-01-02 ENCOUNTER — Encounter: Payer: Self-pay | Admitting: Physical Therapy

## 2016-01-04 ENCOUNTER — Ambulatory Visit: Payer: BLUE CROSS/BLUE SHIELD | Admitting: Physical Therapy

## 2016-01-04 DIAGNOSIS — R2689 Other abnormalities of gait and mobility: Secondary | ICD-10-CM | POA: Diagnosis not present

## 2016-01-04 DIAGNOSIS — R278 Other lack of coordination: Secondary | ICD-10-CM

## 2016-01-04 NOTE — Patient Instructions (Addendum)
Discontinue the kegel with deep core level 1   Do level 1 with out kegel and without pillow   Progress to level 3   Body scan practice daily  headspace app   Try stair climbing sidestep technique, stretches at the landing (3 stretches)

## 2016-01-05 NOTE — Therapy (Signed)
[place] MAIN [facility] SERVICES [address] [place], [place], [address] Phone: [phone]   Fax:  [phone]  Physical Therapy Treatment  Patient Details  Name: Kaylee Gentry MRN: [id number] Date of Birth: May 12, 1960 Referring Provider: Lanelle Bal   Encounter Date: 01/04/2016    Past Medical History:  Diagnosis Date  . Allergy   . GERD (gastroesophageal reflux disease)     Past Surgical History:  Procedure Laterality Date  . CHOLECYSTECTOMY  2014  . COLONOSCOPY  05/01/2009   normal  . EYE SURGERY     iridotomy; bilateral  . SHOULDER SURGERY     right  . UPPER GASTROINTESTINAL ENDOSCOPY    . VAGINAL HYSTERECTOMY  10/22/2015    There were no vitals filed for this visit.      Subjective Assessment - 01/05/16 2327    Subjective Pt reported her arms started hurting with stretches and she stopped and starting to do some in a seated position.  Pt notices some tightness in the vaginal area when walking too fast and walking outside in the cold weather with a tight back. Pt staetd she feels "vulnerable" and "trapped" that she is not able to use the stairs to get to her apt on 3rd floor when her elevator breaks down.         Pertinent History no pregnancies. Hx of gallbladder removal and hysterectomy    Patient Stated Goals strengthen her core to avoid prolpase, get rid of the pain                      Pelvic Floor Special Questions - 01/05/16 2306    Prolapse None  no pillow under hips, bladder more cranial position   Pelvic Floor Internal Exam pt consented verbally with contraindication   Exam Type Vaginal   Palpation no mm tensions   Strength good squeeze, good lift, able to hold agaisnt strong resistance           [facility] Adult PT Treatment/Exercise - 01/05/16 2305      Therapeutic Activites    Therapeutic Activities --  see pt instructions     Neuro Re-ed    Neuro Re-ed Details  pain science education                      PT Long Term Goals - 01/05/16 2324      PT LONG TERM GOAL #1   Title Pt will report improved completely empty her urine 100% of the time instead 80% of the time in order to maintain healthy urinary system and minimize UTIs   Time 12   Period Weeks   Status On-going     PT LONG TERM GOAL #2   Title Pt will report urinating once every 2 hours instead of every  hour in order to restore urinary function.      Time 12   Period Weeks   Status On-going     PT LONG TERM GOAL #3   Title Pt will be able to  delay getting to the toilet when she has the urge to defecate with improved sensory awareness of rectal fullness in order to participate in more morning activities outside the homes   Time 12   Period Weeks   Status On-going     PT LONG TERM GOAL #4   Title Pt will demo decreased tenderness and tensions with pelvic floor muscles across 2 visits in order to decrease pain  and to sit without her cushion   Time 12   Period Weeks   Status Partially Met     PT LONG TERM GOAL #5   Title Pt will be able to sit without a cushion > 30 min in order to sit for meals and social activities.    Time 12   Period Weeks   Status Achieved     PT LONG TERM GOAL #6   Title Pt will be IND with modifications to fitness routien to minimize relapse of Sx    Time 12   Period Weeks   Status On-going     PT LONG TERM GOAL #7   Title Pt will have a change in scores by 5% to indicate greater function on PSQI  67%,  NIH-CPSI  86%,   VSQ 57%   in order to improve QOL   Time 12   Period Weeks   Status On-going     PT LONG TERM GOAL #8   Title Pt will demo ability to perform B hip ER/abduction with feet together with a distance of < 17 cm from lateral tibial plateau to bed across two visits in order to demo increased adductor flexibility and to be prepared for integration back to fitness activities.  (01/04/16 : 22 cm)    Baseline 24 cm    Time 12   Period Weeks   Status  Partially Met     PT LONG TERM GOAL  #9   TITLE Pt will increase her gait from 0.8 m/s to > 1.0 m/s in order to improve gait and walking    Time 12   Period Weeks   Status Partially Met     PT LONG TERM GOAL  #10   TITLE Pt will demo no need for a pillow under her hips to elicit a Grade 9/4/5/8 adnd demo a more cranial position of her bladder in order to return to fitness and have  less pelvic pain    Time 12   Period Weeks   Status Partially Met               Plan - 01/05/16 2322    Clinical Impression Statement Pt showed significantly decreased pelvic floor mm tensions today and a more cranial position of her bladder without her hips elevated onto a pillow. Pt was advised to withhold quick contractions in HEP to minimize risk for overactivity of pelvic floor. Pt required pain science education today which pt voiced understanding. Pt continues to benefit from skilled PT and to progress towards functional activities.   Rehab Potential Good   PT Frequency 1x / week   PT Duration 12 weeks   PT Treatment/Interventions ADLs/Self Care Home Management;Aquatic Therapy;Cryotherapy;Therapeutic exercise;Therapeutic activities;Functional mobility training;Moist Heat;Neuromuscular re-education;Stair training;Patient/family education;Balance training;Manual techniques;Energy conservation;Taping   Consulted and Agree with Plan of Care Patient      Patient will benefit from skilled therapeutic intervention in order to improve the following deficits and impairments:  Pain, Improper body mechanics, Decreased coordination, Decreased mobility, Increased muscle spasms, Postural dysfunction, Decreased endurance, Decreased range of motion, Decreased strength, Hypomobility, Decreased balance, Decreased safety awareness, Impaired flexibility  Visit Diagnosis: Other abnormalities of gait and mobility  Other lack of coordination     Problem List Patient Active Problem List   Diagnosis Date Noted   . Encounter for preventive measure 04/05/2015  . Screening for osteoporosis 08/12/2012  . 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 [place], [place], [address] Phone: [phone]   Fax:  [phone]  Name: Kaylee Gentry MRN: [id number] Date of Birth: Jun 18, 1960

## 2016-01-06 ENCOUNTER — Encounter: Payer: BLUE CROSS/BLUE SHIELD | Admitting: Physical Therapy

## 2016-01-12 ENCOUNTER — Ambulatory Visit: Payer: BLUE CROSS/BLUE SHIELD | Admitting: Physical Therapy

## 2016-01-12 ENCOUNTER — Encounter: Payer: BLUE CROSS/BLUE SHIELD | Admitting: Physical Therapy

## 2016-01-12 DIAGNOSIS — R278 Other lack of coordination: Secondary | ICD-10-CM

## 2016-01-12 DIAGNOSIS — R2689 Other abnormalities of gait and mobility: Secondary | ICD-10-CM

## 2016-01-12 NOTE — Therapy (Signed)
[place] MAIN [facility] SERVICES [address] [place], [place], [address] Phone: [phone]   Fax:  [phone]  Physical Therapy Treatment  Patient Details  Name: Kaylee Gentry MRN: [id number] Date of Birth: [birth year] Referring Provider: Lanelle Bal   Encounter Date: 01/12/2016      PT End of Session - 01/12/16 1451    Visit Number 7   Number of Visits 12   Date for PT Re-Evaluation 03/11/16   PT Start Time 4174   PT Stop Time 1540   PT Time Calculation (min) 59 min      Past Medical History:  Diagnosis Date  . Allergy   . GERD (gastroesophageal reflux disease)     Past Surgical History:  Procedure Laterality Date  . CHOLECYSTECTOMY  2014  . COLONOSCOPY  05/01/2009   normal  . EYE SURGERY     iridotomy; bilateral  . SHOULDER SURGERY     right  . UPPER GASTROINTESTINAL ENDOSCOPY    . VAGINAL HYSTERECTOMY  10/22/2015    There were no vitals filed for this visit.      Subjective Assessment - 01/12/16 1526    Subjective Pt reported she had a short relapse of pain which subsided overnight. Pt continues to gain awareness of her breathing.     Pertinent History no pregnancies. Hx of gallbladder removal and hysterectomy    Patient Stated Goals strengthen her core to avoid prolpase, get rid of the pain            [facility] PT Assessment - 01/12/16 0001      Flexibility   Soft Tissue Assessment /Muscle Length --  20 cm B hip ER/abd                   Pelvic Floor Special Questions - 01/12/16 1528    Prolapse None  no pillow under hips, bladder more cranial position   Pelvic Floor Internal Exam pt consented verbally with contraindication   Exam Type Vaginal   Palpation no mm tensions   Strength good squeeze, good lift, able to hold agaisnt strong resistance           [facility] Adult PT Treatment/Exercise - 01/12/16 1529      Therapeutic Activites    Therapeutic Activities --  see pt instructions     Neuro  Re-ed    Neuro Re-ed Details  answered her questions on breathing technique and pelvic floor activation with breathing technique, reviewed HEP                PT Education - 01/12/16 1533    Education provided Yes   Education Details HEP   Person(s) Educated Patient   Methods Explanation;Demonstration;Tactile cues;Verbal cues;Handout   Comprehension Returned demonstration;Verbalized understanding             PT Long Term Goals - 01/12/16 1533      PT LONG TERM GOAL #1   Title Pt will report improved completely empty her urine 100% of the time instead 80% of the time in order to maintain healthy urinary system and minimize UTIs (12/26: 50%)    Time 12   Period Weeks   Status Partially Met     PT LONG TERM GOAL #2   Title Pt will report urinating once every 2 hours instead of every  hour in order to restore urinary function.      Time 12   Period Weeks   Status Partially Met  PT LONG TERM GOAL #3   Title Pt will be able to delay getting to the toilet when she has the urge to defecate with improved sensory awareness of rectal fullness in order to participate in more morning activities outside the homes   Time 12   Period Weeks   Status Partially Met     PT LONG TERM GOAL #4   Title Pt will demo decreased tenderness and tensions with pelvic floor muscles across 2 visits in order to decrease pain and to sit without her cushion   Time 12   Period Weeks   Status Achieved     PT LONG TERM GOAL #5   Title Pt will be able to sit without a cushion > 30 min in order to sit for meals and social activities.    Time 12   Period Weeks   Status Achieved     PT LONG TERM GOAL #6   Title Pt will be IND with modifications to fitness routien to minimize relapse of Sx    Time 12   Period Weeks   Status Partially Met     PT LONG TERM GOAL #7   Title Pt will have a change in scores by 5% to indicate greater function on PSQI  67%,  NIH-CPSI  86%,   VSQ 57%   in order to  improve QOL   Time 12   Period Weeks   Status Partially Met     PT LONG TERM GOAL #8   Title Pt will demo ability to perform B hip ER/abduction with feet together with a distance of < 20 cm from lateral tibial plateau to bed across two visits in order to demo increased adductor flexibility and to be prepared for integration back to fitness activities.  (01/04/16 : 22 cm)    Baseline 24 cm , 20 cm    Time 12   Period Weeks   Status Achieved     PT LONG TERM GOAL  #9   TITLE Pt will increase her gait from 0.8 m/s to > 1.0 m/s in order to improve gait and walking    Time 12   Period Weeks   Status Partially Met     PT LONG TERM GOAL  #10   TITLE Pt will demo no need for a pillow under her hips and demo a more cranial position of her bladder in order to return to fitness and have  less pelvic pain    Time 12   Period Weeks   Status Achieved               Plan - 01/12/16 1533    Clinical Impression Statement Pt continues to show progress with decreased pelvic floor tightness with increased ability to sit and to walk at a faster speed. Pt showed improved pelvic floor coordination with other deep core mm and a more cranial position of her bladder. Pt progressed to thoracolumbar strengthening exercises today .  Reassess goals at next session.    Rehab Potential Good   PT Frequency 1x / week   PT Duration 12 weeks   PT Treatment/Interventions ADLs/Self Care Home Management;Aquatic Therapy;Cryotherapy;Therapeutic exercise;Therapeutic activities;Functional mobility training;Moist Heat;Neuromuscular re-education;Stair training;Patient/family education;Balance training;Manual techniques;Energy conservation;Taping   Consulted and Agree with Plan of Care Patient      Patient will benefit from skilled therapeutic intervention in order to improve the following deficits and impairments:  Pain, Improper body mechanics, Decreased coordination, Decreased mobility, Increased muscle spasms,  Postural  dysfunction, Decreased endurance, Decreased range of motion, Decreased strength, Hypomobility, Decreased balance, Decreased safety awareness, Impaired flexibility  Visit Diagnosis: Other abnormalities of gait and mobility  Other lack of coordination     Problem List Patient Active Problem List   Diagnosis Date Noted  . Encounter for preventive measure 04/05/2015  . Screening for osteoporosis 08/12/2012  . 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 [place], [place], [address] Phone: [phone]   Fax:  [phone]  Name: Kaylee Gentry MRN: [id number] Date of Birth: November 10, 1960

## 2016-01-12 NOTE — Patient Instructions (Signed)
Bridging series w/ resistive band other side of doorknob:  Level 1:  Position:  Elbows bent, knees hip width apart, heels on chair or stool/  Stabilization points: shoulders, upper arms, back of head pressed into floor. Heel press downward.   Movement: inhale do nothing, exhale pull band by side, lower fists to floor completely while lifting hips.Keep stabilization points engaged when you allow the band to go back to starting position  10 x 2 reps       Level 2:  Position:  Elbows straight, arms raised to ceiling at shoulder height, knees apart like a ballerina,heels together on chair or stool   Stabilization points: shoulders, upper arms, back of head pressed into floor. Heel press downward.   Movement: inhale do nothing, exhale pull band by side, lower fists to floor completely while lifting hips. Keep stabilization points engaged when you allow the band to go back to starting position   10 x 2 reps  Shoulder training: Try to imagine you are squeezing a pencil under your armpit and your shoulder blades are down away from your ears and towards each other

## 2016-01-13 ENCOUNTER — Encounter: Payer: BLUE CROSS/BLUE SHIELD | Admitting: Physical Therapy

## 2016-01-14 ENCOUNTER — Ambulatory Visit: Payer: BLUE CROSS/BLUE SHIELD | Admitting: Physical Therapy

## 2016-01-14 DIAGNOSIS — R2689 Other abnormalities of gait and mobility: Secondary | ICD-10-CM

## 2016-01-14 DIAGNOSIS — R278 Other lack of coordination: Secondary | ICD-10-CM

## 2016-01-14 NOTE — Therapy (Signed)
[place] MAIN [facility] SERVICES [address] [place], [place], [address] Phone: [phone]   Fax:  [phone]  Physical Therapy Treatment / Progress Note  Patient Details  Name: Kaylee Gentry MRN: [id number] Date of Birth: 1960/12/23 Referring Provider: Lanelle Bal   Encounter Date: 01/14/2016      PT End of Session - 01/14/16 1806    Visit Number 8   Number of Visits 12   Date for PT Re-Evaluation 03/11/16   PT Start Time 3235   PT Stop Time 1530   PT Time Calculation (min) 55 min      Past Medical History:  Diagnosis Date  . Allergy   . GERD (gastroesophageal reflux disease)     Past Surgical History:  Procedure Laterality Date  . CHOLECYSTECTOMY  2014  . COLONOSCOPY  05/01/2009   normal  . EYE SURGERY     iridotomy; bilateral  . SHOULDER SURGERY     right  . UPPER GASTROINTESTINAL ENDOSCOPY    . VAGINAL HYSTERECTOMY  10/22/2015    There were no vitals filed for this visit.      Subjective Assessment - 01/14/16 1441    Subjective Pt reported she had the best "peeing day" the day after last treatment which meant her flow was completely normal and her frequency was less. The day pt performed the band exercises, pt had difficulty with eliminating urine. This morning, the difficulty with uriantion persists and there is the vaginal area at 80' o' clock position . Pt states she had her knees too wide with the band exercises and then felt vaginal pain what was sharp 5/10 with the "tampon feeling" . Today the pain has subsided to 3/10 and the tampon feeling disappeared after pt performed her stretches.     Pertinent History no pregnancies. Hx of gallbladder removal and hysterectomy    Patient Stated Goals strengthen her core to avoid prolpase, get rid of the pain            [facility] PT Assessment - 01/14/16 1749      Ambulation/Gait   Stair Management Technique Sideways  no report of pain w/16" steps,2x up/down BUE, single UE   Gait Comments shoulders more posterior to COM, swifter gait following cues for more anterior COM.                   Pelvic Floor Special Questions - 01/14/16 1752    Prolapse None  no pillow under hips, bladder more cranial position   Pelvic Floor Internal Exam pt consented verbally with contraindication   Exam Type Vaginal   Palpation palpation at R anterior attachment of puborectalis with report of referred pain at pubic bone externally.  Applied functional activities: simulated bridging band exercise without band, pt demo'd increased pelvic floor and abdominal mm tensions with bridge. less abdominopelvic tensions following cues for scapular /cervical retraction            [facility] Adult PT Treatment/Exercise - 01/14/16 1749      Therapeutic Activites    Therapeutic Activities --  see pt instructions     Neuro Re-ed    Neuro Re-ed Details  see pt instructions                PT Education - 01/14/16 1804    Education provided Yes   Education Details HEP, biopsychosocial approaches, reinforcing responses to pain with use of tools learned from PT and to minimize excessive effort with exercises, gait training  walking and stair climbing   Person(s) Educated Patient   Methods Explanation;Demonstration;Tactile cues;Verbal cues;Handout   Comprehension Returned demonstration;Verbalized understanding;Verbal cues required;Tactile cues required             PT Long Term Goals - 01/14/16 1447      PT LONG TERM GOAL #1   Title Pt will report improved completely empty her urine 100% of the time instead 80% of the time in order to maintain healthy urinary system and minimize UTIs (12/26: 50%)    Time 12   Period Weeks   Status Partially Met     PT LONG TERM GOAL #2   Title Pt will report urinating once every 2 hours instead of every  hour in order to restore urinary function.      Time 12   Period Weeks   Status Partially Met     PT LONG TERM GOAL #3   Title Pt  will be able to delay getting to the toilet when she has the urge to defecate with improved sensory awareness of rectal fullness in order to participate in more morning activities outside the homes   Time 12   Period Weeks   Status Partially Met     PT LONG TERM GOAL #4   Title Pt will demo decreased tenderness and tensions with pelvic floor muscles across 2 visits in order to decrease pain and to sit without her cushion   Time 12   Period Weeks   Status Achieved     PT LONG TERM GOAL #5   Title Pt will be able to sit without a cushion > 30 min in order to sit for meals and social activities.    Time 12   Period Weeks   Status Achieved     PT LONG TERM GOAL #6   Title Pt will be IND with modifications to fitness routien to minimize relapse of Sx    Time 12   Period Weeks   Status Partially Met     PT LONG TERM GOAL #7   Title Pt will have a change in scores by 5% to indicate greater function on PSQI  67%,  NIH-CPSI  86%,   VSQ 57%   in order to improve QOL   Time 12   Period Weeks   Status Partially Met     PT LONG TERM GOAL #8   Title Pt will demo ability to perform B hip ER/abduction with feet together with a distance of < 20 cm from lateral tibial plateau to bed across two visits in order to demo increased adductor flexibility and to be prepared for integration back to fitness activities.  (01/04/16 : 22 cm)    Baseline 24 cm , 20 cm    Time 12   Period Weeks   Status Achieved     PT LONG TERM GOAL  #9   TITLE Pt will increase her gait from 0.8 m/s to > 1.0 m/s in order to improve gait and walking    Time 12   Period Weeks   Status Partially Met     PT LONG TERM GOAL  #10   TITLE Pt will demo no need for a pillow under her hips and demo a more cranial position of her bladder in order to return to fitness and have  less pelvic pain    Time 12   Period Weeks   Status Achieved  Plan - 01/14/16 1806    Clinical Impression Statement Pt has achieved  4/10 goals across the past 8 visits. Pt shows significantly decreased pelvic floor mm tensions and increased ability to tolerate internal pelvic floor manual Tx. This week, pt expressed excitement that she achieved complete urination with less difficulty following last session but had a relapse due to overactivity of abdominopelvic floor mm with theraband exercises. Initiated self-abdominal massage to faciliate continued relaxation of abdominal mm, and educated pt on minimizing overactivity of abdominopelvic mm with  bridging exercises (downgraded).  Incorporated sidestepping up/down stairs and gait training.  Reinforced pain science education on response to pain and increased pt's confidence on self-management with relapse of pain.  Pt continues to progress well towards her goals.     Rehab Potential Good   PT Frequency 1x / week   PT Duration 12 weeks   PT Treatment/Interventions ADLs/Self Care Home Management;Aquatic Therapy;Cryotherapy;Therapeutic exercise;Therapeutic activities;Functional mobility training;Moist Heat;Neuromuscular re-education;Stair training;Patient/family education;Balance training;Manual techniques;Energy conservation;Taping   Consulted and Agree with Plan of Care Patient      Patient will benefit from skilled therapeutic intervention in order to improve the following deficits and impairments:  Pain, Improper body mechanics, Decreased coordination, Decreased mobility, Increased muscle spasms, Postural dysfunction, Decreased endurance, Decreased range of motion, Decreased strength, Hypomobility, Decreased balance, Decreased safety awareness, Impaired flexibility  Visit Diagnosis: Other abnormalities of gait and mobility  Other lack of coordination     Problem List Patient Active Problem List   Diagnosis Date Noted  . Encounter for preventive measure 04/05/2015  . Screening for osteoporosis 08/12/2012  . 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 [place], [place], [address] Phone: [phone]   Fax:  [phone]  Name: Kaylee Gentry MRN: [id number] Date of Birth: 1960-07-20

## 2016-01-14 NOTE — Patient Instructions (Signed)
Downgrade on bridging band exercises: Practice on the floor without bands, without stool Decrease abdominopelvic tensions by ensuring stabilizing points in feet, shoulders (gently squeezing shoulder blades together, not maximal contraction)  Perform 3 reps/ day   Apply relaxation following strengthening exercise: stretches or abdominal massage  Or body scan   ______  Sidestepping stairs 1 flight x 2 different times of the day for one week, increase to 3 different times/ day next week  ______  Abdominal massage (handout) before bed as a relaxation practice  ______ Walking with navel over shin/toes as you step forward, no need to keep shoulders extremely  back

## 2016-01-26 ENCOUNTER — Encounter: Payer: BLUE CROSS/BLUE SHIELD | Admitting: Physical Therapy

## 2016-01-27 ENCOUNTER — Encounter: Payer: Self-pay | Admitting: Physical Therapy

## 2016-02-03 ENCOUNTER — Encounter: Payer: BLUE CROSS/BLUE SHIELD | Admitting: Physical Therapy

## 2016-02-04 ENCOUNTER — Ambulatory Visit: Payer: BLUE CROSS/BLUE SHIELD | Admitting: Physical Therapy

## 2016-02-11 ENCOUNTER — Encounter: Payer: BLUE CROSS/BLUE SHIELD | Admitting: Physical Therapy

## 2016-02-12 ENCOUNTER — Ambulatory Visit: Payer: BLUE CROSS/BLUE SHIELD | Attending: Obstetrics and Gynecology | Admitting: Physical Therapy

## 2016-02-12 DIAGNOSIS — R2689 Other abnormalities of gait and mobility: Secondary | ICD-10-CM | POA: Insufficient documentation

## 2016-02-12 DIAGNOSIS — R278 Other lack of coordination: Secondary | ICD-10-CM | POA: Diagnosis present

## 2016-02-12 NOTE — Patient Instructions (Addendum)
   1. Sideplank on R only:  At the Counter version with mini pushup 5 reps x 3 x day x 3 x day  2. Seated , ankles to the L of hips ___>  Stretch of the L low back rounding around a ball  3-5 reps 5 sec holds 5 reps x 3 x day   3. Full side plank on elbows , Stability points in forearm and outer edge of the feet  3 breaths x 3 reps  3 x day  _________________   4. chair pose (mini squat) ,  Cross L hand on R thigh R hand on R hip, revolve trunk to the R   5 breaths    3 x day __________________    Standing obliques Standing on L leg L elbow to R knee Place R foot back on the ground, ensure 50% weight on both feet before the next rep      10 reps on exhale  X 2 set/  3 x day

## 2016-02-12 NOTE — Therapy (Signed)
[place] MAIN [facility] SERVICES [address] [place], [place], [address] Phone: [phone]   Fax:  [phone]  Physical Therapy Treatment / Progress Note  Patient Details  Name: Kaylee Gentry MRN: [id number] Date of Birth: 04-Apr-1960 Referring Provider: Lanelle Bal   Encounter Date: 02/12/2016      PT End of Session - 02/12/16 1306    Visit Number 9   Number of Visits 12   Date for PT Re-Evaluation 03/11/16   PT Start Time 1204   PT Stop Time 1310   PT Time Calculation (min) 66 min   Activity Tolerance Patient tolerated treatment well;No increased pain   Behavior During Therapy WFL for tasks assessed/performed      Past Medical History:  Diagnosis Date  . Allergy   . GERD (gastroesophageal reflux disease)     Past Surgical History:  Procedure Laterality Date  . CHOLECYSTECTOMY  2014  . COLONOSCOPY  05/01/2009   normal  . EYE SURGERY     iridotomy; bilateral  . SHOULDER SURGERY     right  . UPPER GASTROINTESTINAL ENDOSCOPY    . VAGINAL HYSTERECTOMY  10/22/2015    There were no vitals filed for this visit.      Subjective Assessment - 02/12/16 1207    Subjective Pt returns after 4 weeks. Pt reports her mm spasms have improved 50-60%. Pt has not had the "tampon feeling " for the past 2 weeks. Pt does not want to the perform the bridging series exercises anymore because they set her back with overuse of groin mm.    Pertinent History no pregnancies. Hx of gallbladder removal and hysterectomy    Patient Stated Goals strengthen her core to avoid prolpase, get rid of the pain            [facility] PT Assessment - 02/12/16 1307      Functional Tests   Functional tests --  demo'd L rotation dominant with tennis movement     Posture/Postural Control   Posture Comments diaphragmatic breathing L lateral shift of ribcage on inhalation, with QL more dominate on L  increased weightbearing on LLE     Palpation   Palpation comment  more mm bulk on L QL > R,                       [facility] Adult PT Treatment/Exercise - 02/12/16 1307      Therapeutic Activites    Therapeutic Activities --  see pt instruction     Neuro Re-ed    Neuro Re-ed Details  see  pt instructions                PT Education - 02/12/16 1302    Education provided Yes   Education Details HEP             PT Long Term Goals - 02/12/16 1353      PT LONG TERM GOAL #1   Title Pt will report improved completely empty her urine 100% of the time instead 80% of the time in order to maintain healthy urinary system and minimize UTIs (12/26: 50%, 1/26: 80%) )    Time 12   Period Weeks   Status Partially Met     PT LONG TERM GOAL #2   Title Pt will report urinating once every 2 hours instead of every  hour in order to restore urinary function.      Time 12   Period Weeks  Status Partially Met     PT LONG TERM GOAL #3   Title Pt will be participate in more morning activities outside the homes by being able to leave the house by 9 or 10am in order to demo more confidence and more control with bowel movements    Time 12   Period Weeks   Status Partially Met     PT LONG TERM GOAL #4   Title Pt will demo decreased tenderness and tensions with pelvic floor muscles across 2 visits in order to decrease pain and to sit without her cushion   Time 12   Period Weeks   Status Achieved     PT LONG TERM GOAL #5   Title Pt will be able to sit without a cushion > 30 min in order to sit for meals and social activities.    Time 12   Period Weeks   Status Achieved     Additional Long Term Goals   Additional Long Term Goals Yes     PT LONG TERM GOAL #6   Title Pt will be IND with modifications to fitness routine to minimize relapse of Sx    Time 12   Period Weeks   Status Partially Met     PT LONG TERM GOAL #7   Title Pt will have a change in scores by 5% to indicate greater function on PSQI  67%,  NIH-CPSI  86%,   VSQ 57%    in order to improve QOL   Time 12   Period Weeks   Status Partially Met     PT LONG TERM GOAL #8   Title Pt will demo ability to perform B hip ER/abduction with feet together with a distance of < 20 cm from lateral tibial plateau to bed across two visits in order to demo increased adductor flexibility and to be prepared for integration back to fitness activities.  (01/04/16 : 22 cm)    Baseline 24 cm , 20 cm    Time 12   Period Weeks   Status Achieved     PT LONG TERM GOAL  #9   TITLE Pt will increase her gait from 0.8 m/s to > 1.0 m/s in order to improve gait and walking    Time 12   Period Weeks   Status Achieved     PT LONG TERM GOAL  #10   TITLE Pt will demo no need for a pillow under her hips and demo a more cranial position of her bladder in order to return to fitness and have  less pelvic pain    Time 12   Period Weeks   Status Achieved     PT LONG TERM GOAL  #11   TITLE Pt will demo less L lateral shift of ribcage and shoulder on inhalation, increased mm of QL in order to correct mm imbalance from tennis playing in order to minimize relapse of Sx   Time 12   Period Weeks   Status New               Plan - 02/12/16 1347    Clinical Impression Statement Pt has achieved 5/11 goals with decreased pain by 50-60% and improved ability to urinate. Pt has shown significantly decreased mm tensions in her pelvic, back, and LE regions. Pt has demo'd increased ability to lengthen her pelvic floor mm and elicit a more cranial position of her bladder with proper deep core coordination and proper body mechanics that do  not strain nor bear down on her pelvic floor.  Pt is progressing towards modified fitness exericses to minimize relapse of Sx and to correct mm overuse of her L QL mm/obliques/ underactivated R QL and obliques from tennis playing. Pt demo'd proper form and technique today. She is progressing well towards her remaining goals with continued skilled PT.     Rehab Potential  Good   PT Frequency 1x / week   PT Duration 12 weeks   PT Treatment/Interventions ADLs/Self Care Home Management;Aquatic Therapy;Cryotherapy;Therapeutic exercise;Therapeutic activities;Functional mobility training;Moist Heat;Neuromuscular re-education;Stair training;Patient/family education;Balance training;Manual techniques;Energy conservation;Taping   Consulted and Agree with Plan of Care Patient      Patient will benefit from skilled therapeutic intervention in order to improve the following deficits and impairments:  Pain, Improper body mechanics, Decreased coordination, Decreased mobility, Increased muscle spasms, Postural dysfunction, Decreased endurance, Decreased range of motion, Decreased strength, Hypomobility, Decreased balance, Decreased safety awareness, Impaired flexibility  Visit Diagnosis: Other abnormalities of gait and mobility  Other lack of coordination     Problem List Patient Active Problem List   Diagnosis Date Noted  . Encounter for preventive measure 04/05/2015  . Screening for osteoporosis 08/12/2012  . 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 [place], [place], [address] Phone: [phone]   Fax:  [phone]  Name: Kaylee Gentry MRN: [id number] Date of Birth: 01-06-1961

## 2016-02-22 ENCOUNTER — Ambulatory Visit: Payer: BLUE CROSS/BLUE SHIELD | Attending: Obstetrics and Gynecology | Admitting: Physical Therapy

## 2016-02-22 DIAGNOSIS — R278 Other lack of coordination: Secondary | ICD-10-CM

## 2016-02-22 DIAGNOSIS — R2689 Other abnormalities of gait and mobility: Secondary | ICD-10-CM | POA: Insufficient documentation

## 2016-02-23 NOTE — Therapy (Signed)
[place] MAIN [facility] SERVICES [address] [place], [place], [address] Phone: [phone]   Fax:  [phone]  Physical Therapy Treatment  Patient Details  Name: Kaylee Gentry MRN: [id number] Date of Birth: April 08, 1960 Referring Provider: Lanelle Bal   Encounter Date: 02/22/2016      PT End of Session - 02/23/16 2211    Visit Number 10   Number of Visits --   Date for PT Re-Evaluation 03/11/16   PT Start Time 7408   PT Stop Time 1510   PT Time Calculation (min) 67 min   Activity Tolerance Patient tolerated treatment well;No increased pain   Behavior During Therapy WFL for tasks assessed/performed      Past Medical History:  Diagnosis Date  . Allergy   . GERD (gastroesophageal reflux disease)     Past Surgical History:  Procedure Laterality Date  . CHOLECYSTECTOMY  2014  . COLONOSCOPY  05/01/2009   normal  . EYE SURGERY     iridotomy; bilateral  . SHOULDER SURGERY     right  . UPPER GASTROINTESTINAL ENDOSCOPY    . VAGINAL HYSTERECTOMY  10/22/2015    There were no vitals filed for this visit.      Subjective Assessment - 02/22/16 1428    Subjective Pt reported her mm spasms have improved by 70% and she has been taking Mg supplements. Pt has difficulty fully emptying her bladder but it has improved by 70% .  Pt would like to learn core exercises.    Pertinent History no pregnancies. Hx of gallbladder removal and hysterectomy    Patient Stated Goals strengthen her core to avoid prolpase, get rid of the pain            [facility] PT Assessment - 02/23/16 2204      Observation/Other Assessments   Observations shoulder limitation for full scap retraction R UE with Hx of surgery     Other:   Other/ Comments achieved full childs pose posture compared to limited mobility at previous session     Posture/Postural Control   Posture Comments slightly decreased L lateral shift with diaphragm excursion, plan to assess shoulder (possible  deactivation of R UE posterior chain)      Strength   Overall Strength --  serratus anterior R 3/5, L 4/5                      [facility] Adult PT Treatment/Exercise - 02/23/16 2204      Therapeutic Activites    Therapeutic Activities --  see pt instructions     Neuro Re-ed    Neuro Re-ed Details  see pt instructions                PT Education - 02/23/16 2210    Education provided Yes   Education Details HEP   Person(s) Educated Patient   Methods Explanation;Demonstration;Tactile cues;Verbal cues   Comprehension Returned demonstration;Verbalized understanding             PT Long Term Goals - 02/12/16 1353      PT LONG TERM GOAL #1   Title Pt will report improved completely empty her urine 100% of the time instead 80% of the time in order to maintain healthy urinary system and minimize UTIs (12/26: 50%, 1/26: 80%) )    Time 12   Period Weeks   Status Partially Met     PT LONG TERM GOAL #2   Title Pt will report urinating once  every 2 hours instead of every  hour in order to restore urinary function.      Time 12   Period Weeks   Status Partially Met     PT LONG TERM GOAL #3   Title Pt will be participate in more morning activities outside the homes by being able to leave the house by 9 or 10am in order to demo more confidence and more control with bowel movements    Time 12   Period Weeks   Status Partially Met     PT LONG TERM GOAL #4   Title Pt will demo decreased tenderness and tensions with pelvic floor muscles across 2 visits in order to decrease pain and to sit without her cushion   Time 12   Period Weeks   Status Achieved     PT LONG TERM GOAL #5   Title Pt will be able to sit without a cushion > 30 min in order to sit for meals and social activities.    Time 12   Period Weeks   Status Achieved     Additional Long Term Goals   Additional Long Term Goals Yes     PT LONG TERM GOAL #6   Title Pt will be IND with modifications to  fitness routine to minimize relapse of Sx    Time 12   Period Weeks   Status Partially Met     PT LONG TERM GOAL #7   Title Pt will have a change in scores by 5% to indicate greater function on PSQI  67%,  NIH-CPSI  86%,   VSQ 57%   in order to improve QOL   Time 12   Period Weeks   Status Partially Met     PT LONG TERM GOAL #8   Title Pt will demo ability to perform B hip ER/abduction with feet together with a distance of < 20 cm from lateral tibial plateau to bed across two visits in order to demo increased adductor flexibility and to be prepared for integration back to fitness activities.  (01/04/16 : 22 cm)    Baseline 24 cm , 20 cm    Time 12   Period Weeks   Status Achieved     PT LONG TERM GOAL  #9   TITLE Pt will increase her gait from 0.8 m/s to > 1.0 m/s in order to improve gait and walking    Time 12   Period Weeks   Status Achieved     PT LONG TERM GOAL  #10   TITLE Pt will demo no need for a pillow under her hips and demo a more cranial position of her bladder in order to return to fitness and have  less pelvic pain    Time 12   Period Weeks   Status Achieved     PT LONG TERM GOAL  #11   TITLE Pt will demo less L lateral shift of ribcage and shoulder on inhalation, increased mm of QL in order to correct mm imbalance from tennis playing in order to minimize relapse of Sx   Time 12   Period Weeks   Status New               Plan - 02/23/16 2212    Clinical Impression Statement Pt continues to demo significantly increased spinal and LE flexbility. She achieved full child pose which she was not able to perform in the past sessions. Pt is progressing to yoga postures (standing,  half kneeling) to faciliate adductor flexbility which she c/o tightness. Reinforced restorative postures with anticipation that continued relaxation training will pt achieve the remaining difficulty she has with initiating urination. Pt required explanations on modified to typcial core  exercises and how to select other exercises to minmize strain on the spine and pelvic floor. Pt voiced understanding. Exercises will be modified with consideration of weakness in R serratus anterior mm and rounded shoulders with Hx of shoulder surgery.  Pt will continue to benefit from skilled PT.      Rehab Potential Good   PT Frequency 1x / week   PT Duration 12 weeks   PT Treatment/Interventions ADLs/Self Care Home Management;Aquatic Therapy;Cryotherapy;Therapeutic exercise;Therapeutic activities;Functional mobility training;Moist Heat;Neuromuscular re-education;Stair training;Patient/family education;Balance training;Manual techniques;Energy conservation;Taping   Consulted and Agree with Plan of Care Patient      Patient will benefit from skilled therapeutic intervention in order to improve the following deficits and impairments:  Pain, Improper body mechanics, Decreased coordination, Decreased mobility, Increased muscle spasms, Postural dysfunction, Decreased endurance, Decreased range of motion, Decreased strength, Hypomobility, Decreased balance, Decreased safety awareness, Impaired flexibility  Visit Diagnosis: Other abnormalities of gait and mobility  Other lack of coordination     Problem List Patient Active Problem List   Diagnosis Date Noted  . Encounter for preventive measure 04/05/2015  . Screening for osteoporosis 08/12/2012  . 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

## 2016-02-23 NOTE — Patient Instructions (Signed)
Filmed standing and floor yoga sequence with alignment cues Poses promoting adductor lengthening, thoracic rotation  Restorative pose -->   childs pose on pillows

## 2016-03-08 ENCOUNTER — Ambulatory Visit: Payer: BLUE CROSS/BLUE SHIELD | Admitting: Physical Therapy

## 2016-03-08 DIAGNOSIS — R2689 Other abnormalities of gait and mobility: Secondary | ICD-10-CM

## 2016-03-08 DIAGNOSIS — R278 Other lack of coordination: Secondary | ICD-10-CM

## 2016-03-08 NOTE — Patient Instructions (Signed)
See answers and modifications to your questions about previously self-selected exercises  Principles of exercise, endurance training , fatigue as discussed  Standing PNF band exercises  To counter the rotation in tennis and golf   "Drawing a sword" " Pulling a lawnmover"  Band is over the edge of the door, knot is on the other side of the door,  R hand holds end of the band by L ear level   Press downward into the ballmounds and heels of the feet, thigh muscle active, Keep knees and hips squared the front and do not move them throughout the activity, Exhale to pull band across the face to the R pocket, rotating only your ribcage    10 x 2 reps    "Pulling seat belt"   Band over door knob with knot on the other side of the door Stand perpendicular to the door,  Exhale to pull band from R ear height across body to the L pocket without turning pelvis and knees  Follow hand with eyes/head  10 x 2 reps

## 2016-03-10 NOTE — Therapy (Signed)
[place] MAIN [facility] SERVICES [address] [place], [place], [address] Phone: [phone]   Fax:  [phone]  Physical Therapy Treatment  Patient Details  Name: Kaylee Gentry MRN: [id number] Date of Birth: 1960/03/23 Referring Provider: Lanelle Bal   Encounter Date: 03/08/2016      PT End of Session - 03/08/16 1819    Visit Number 11   Number of Visits 12   Date for PT Re-Evaluation 03/11/16   PT Start Time 6063   PT Stop Time 1820   PT Time Calculation (min) 76 min   Activity Tolerance Patient tolerated treatment well;No increased pain   Behavior During Therapy WFL for tasks assessed/performed      Past Medical History:  Diagnosis Date  . Allergy   . GERD (gastroesophageal reflux disease)     Past Surgical History:  Procedure Laterality Date  . CHOLECYSTECTOMY  2014  . COLONOSCOPY  05/01/2009   normal  . EYE SURGERY     iridotomy; bilateral  . SHOULDER SURGERY     right  . UPPER GASTROINTESTINAL ENDOSCOPY    . VAGINAL HYSTERECTOMY  10/22/2015    There were no vitals filed for this visit.      Subjective Assessment - 03/10/16 0008    Subjective Pt has had no pain across the past 2 weeks except on occasion and she was able to decrease the pain with self-massage. Pt has been able to climb 3 flights ofstairs sideways without pain. Pt tried swimming 2x/ week last week for one hour and she experienced fatigue for atleast 1.5 days afterwards. The following week, pt swam 3x/ week and continue to feel exhausted this week.  Pt would like to return to playing tennis 2-3 x week (2 hrs at a time  ) , golf 2x week (walking for 4 hrs at time),  biking 2 x week (1.5 hrs road biking [place]), swimming 1 x week (1 hr and running with a floating) . Pt likes to perform sit-ups.  Pt has not had urination problems.   Pertinent History no pregnancies. Hx of gallbladder removal and hysterectomy    Patient Stated Goals strengthen her core to avoid  prolpase, get rid of the pain            [facility] PT Assessment - 03/10/16 0008      Posture/Postural Control   Posture Comments less lateral shift with diaphragmatic breathing                      [facility] Adult PT Treatment/Exercise - 03/10/16 0008      Therapeutic Activites    Therapeutic Activities --  see pt instructions     Neuro Re-ed    Neuro Re-ed Details  see pt instructions                PT Education - 03/10/16 0010    Education provided Yes   Education Details HEP, rationale to modifications to self-selected core fitness   Person(s) Educated Patient   Methods Explanation;Demonstration;Tactile cues;Verbal cues;Handout   Comprehension Returned demonstration;Verbalized understanding             PT Long Term Goals - 03/10/16 0019      PT LONG TERM GOAL #1   Title Pt will report improved completely empty her urine 100% of the time instead 80% of the time in order to maintain healthy urinary system and minimize UTIs (12/26: 50%, 1/26: 80%) )    Time  12   Period Weeks   Status Achieved     PT LONG TERM GOAL #2   Title Pt will report urinating once every 2 hours instead of every  hour in order to restore urinary function.      Time 12   Period Weeks   Status Achieved     PT LONG TERM GOAL #3   Title Pt will be participate in more morning activities outside the homes by being able to leave the house by 9 or 10am in order to demo more confidence and more control with bowel movements    Time 12   Period Weeks   Status Partially Met     PT LONG TERM GOAL #4   Title Pt will demo decreased tenderness and tensions with pelvic floor muscles across 2 visits in order to decrease pain and to sit without her cushion   Time 12   Period Weeks   Status Achieved     PT LONG TERM GOAL #5   Title Pt will be able to sit without a cushion > 30 min in order to sit for meals and social activities.    Time 12   Period Weeks   Status Achieved     PT  LONG TERM GOAL #6   Title Pt will be IND with modifications to fitness routine to minimize relapse of Sx    Time 12   Period Weeks   Status Partially Met     PT LONG TERM GOAL #7   Title Pt will have a change in scores by 5% to indicate greater function on PSQI  67%,  NIH-CPSI  86%,   VSQ 57%   in order to improve QOL   Time 12   Period Weeks   Status Partially Met     PT LONG TERM GOAL #8   Title Pt will demo ability to perform B hip ER/abduction with feet together with a distance of < 20 cm from lateral tibial plateau to bed across two visits in order to demo increased adductor flexibility and to be prepared for integration back to fitness activities.  (01/04/16 : 22 cm)    Baseline 24 cm , 20 cm    Time 12   Period Weeks   Status Achieved     PT LONG TERM GOAL  #9   TITLE Pt will increase her gait from 0.8 m/s to > 1.0 m/s in order to improve gait and walking    Time 12   Period Weeks   Status Achieved     PT LONG TERM GOAL  #10   TITLE Pt will demo no need for a pillow under her hips and demo a more cranial position of her bladder in order to return to fitness and have  less pelvic pain    Time 12   Period Weeks   Status Achieved     PT LONG TERM GOAL  #11   TITLE Pt will demo less L lateral shift of ribcage and shoulder on inhalation, increased mm of QL in order to correct mm imbalance from tennis playing in order to minimize relapse of Sx   Time 12   Period Weeks   Status Achieved               Plan - 03/10/16 0014    Clinical Impression Statement Pt reports no more problem with urination and significantly less body tensions.  Pt shows good improvement with breathing technique but required HEP  to decrease mm imbalances from tennis and golfing which are the primary sports that she would like to return to in addition to biking, swimming, and lifting weights . Provided  rationale to avoid certain core exercises in order to minimize prolapse. Discussed strategies and  provided education on principles of exercise, frequency, intensity, and ways to minimize fatigue. New HEP Pt voiced understanding.  Pt continues to benefit from skilled PT.    Rehab Potential Good   PT Frequency 1x / week   PT Duration 12 weeks   PT Treatment/Interventions ADLs/Self Care Home Management;Aquatic Therapy;Cryotherapy;Therapeutic exercise;Therapeutic activities;Functional mobility training;Moist Heat;Neuromuscular re-education;Stair training;Patient/family education;Balance training;Manual techniques;Energy conservation;Taping   Consulted and Agree with Plan of Care Patient      Patient will benefit from skilled therapeutic intervention in order to improve the following deficits and impairments:  Pain, Improper body mechanics, Decreased coordination, Decreased mobility, Increased muscle spasms, Postural dysfunction, Decreased endurance, Decreased range of motion, Decreased strength, Hypomobility, Decreased balance, Decreased safety awareness, Impaired flexibility  Visit Diagnosis: Other abnormalities of gait and mobility  Other lack of coordination     Problem List Patient Active Problem List   Diagnosis Date Noted  . Encounter for preventive measure 04/05/2015  . Screening for osteoporosis 08/12/2012  . 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 [place], [place], [address] Phone: [phone]   Fax:  [phone]  Name: Mikinzie Maciejewski MRN: [id number] Date of Birth: 12-11-1960

## 2016-03-28 ENCOUNTER — Ambulatory Visit: Payer: BLUE CROSS/BLUE SHIELD | Attending: Obstetrics and Gynecology | Admitting: Physical Therapy

## 2016-03-28 DIAGNOSIS — R2689 Other abnormalities of gait and mobility: Secondary | ICD-10-CM | POA: Diagnosis not present

## 2016-03-28 DIAGNOSIS — R278 Other lack of coordination: Secondary | ICD-10-CM

## 2016-03-28 NOTE — Patient Instructions (Signed)
Modified her shoulder exercise:   Video recorded  _______  Wall squat with small "w" holds 5 sec, 5 reps    Yellow band on doorknob instead of using dumbells in sideyling 45 deg  Internal rotation  5 reps double band  External rotation 5 reps double band    Doorway , fists press out 5 sec holds, 5 reps    ________  THEMES:  Breathing with less pushing of lower abdomen , hands on ribcage Less effort, soft breath not deep breath    Activites: less is more

## 2016-03-29 NOTE — Therapy (Signed)
[place] MAIN [facility] SERVICES [address] [place], [place], [address] Phone: [phone]   Fax:  [phone]  Physical Therapy Treatment  Patient Details  Name: Kaylee Gentry MRN: [id number] Date of Birth: [birth year] Referring Provider: Lanelle Bal   Encounter Date: 03/28/2016      PT End of Session - 03/29/16 2248    Visit Number 12   Number of Visits 24   Date for PT Re-Evaluation 06/20/16   PT Start Time 1105   PT Stop Time 1200   PT Time Calculation (min) 55 min      Past Medical History:  Diagnosis Date  . Allergy   . GERD (gastroesophageal reflux disease)     Past Surgical History:  Procedure Laterality Date  . CHOLECYSTECTOMY  2014  . COLONOSCOPY  05/01/2009   normal  . EYE SURGERY     iridotomy; bilateral  . SHOULDER SURGERY     right  . UPPER GASTROINTESTINAL ENDOSCOPY    . VAGINAL HYSTERECTOMY  10/22/2015    There were no vitals filed for this visit.      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 Pt also had pain with the PNF exercise.  Pt noticed she overdid her exercises. Pt also has decreased her swimming time to prevent fatigue.  Pt has been doing stairclimbing in the sideways position and has not issues.  Pt has noticed tenderness over suprapubic area.   Pertinent History no pregnancies. Hx of gallbladder removal and hysterectomy    Patient Stated Goals strengthen her core to avoid prolpase, get rid of the pain            [facility] PT Assessment - 03/29/16 2246      Observation/Other Assessments   Observations poor form and technique with exercises that she was prescribed in prior shoulder rehab. Pt demo'd increased shoulder mobility with simulated movements in tennis and golfing and swimming.      Coordination   Gross  Motor Movements are Fluid and Coordinated --  excessive chest rising, abdominal strain                     [facility] Adult PT Treatment/Exercise - 03/29/16 2248      Therapeutic Activites    Therapeutic Activities --  see pt instructions     Neuro Re-ed    Neuro Re-ed Details  see pt instructions                PT Education - 03/29/16 2248    Education provided Yes   Education Details HEP   Person(s) Educated Patient   Methods Explanation;Demonstration;Tactile cues;Verbal cues;Handout   Comprehension Returned demonstration;Verbalized understanding             PT Long Term Goals - 03/29/16 2256      PT LONG TERM GOAL #1   Title Pt will report improved completely empty her urine 100% of the time instead 80% of the time in order to maintain healthy urinary system and minimize UTIs (12/26: 50%, 1/26: 80%) )    Time 12   Period Weeks   Status Achieved     PT LONG TERM GOAL #2   Title Pt will report urinating once every 2 hours instead of every  hour in order to restore  urinary function.      Time 12   Period Weeks   Status Achieved     PT LONG TERM GOAL #3   Title Pt will be participate in more morning activities outside the homes by being able to leave the house by 9 or 10am in order to demo more confidence and more control with bowel movements    Time 12   Period Weeks   Status Partially Met     PT LONG TERM GOAL #4   Title Pt will demo decreased tenderness and tensions with pelvic floor muscles across 2 visits in order to decrease pain and to sit without her cushion   Time 12   Period Weeks   Status Achieved     PT LONG TERM GOAL #5   Title Pt will be able to sit without a cushion > 30 min in order to sit for meals and social activities.    Time 12   Period Weeks   Status Achieved     Additional Long Term Goals   Additional Long Term Goals Yes     PT LONG TERM GOAL #6   Title Pt will be IND with modifications to fitness routine to  minimize relapse of Sx    Time 12   Period Weeks   Status Partially Met     PT LONG TERM GOAL #7   Title Pt will have a change in scores by 5% to indicate greater function on PSQI  67%,  NIH-CPSI  86%,   VSQ 57%   in order to improve QOL   Time 12   Period Weeks   Status Partially Met     PT LONG TERM GOAL #8   Title Pt will demo ability to perform B hip ER/abduction with feet together with a distance of < 20 cm from lateral tibial plateau to bed across two visits in order to demo increased adductor flexibility and to be prepared for integration back to fitness activities.  (01/04/16 : 22 cm)    Baseline 24 cm , 20 cm    Time 12   Period Weeks   Status Achieved     PT LONG TERM GOAL  #9   TITLE Pt will increase her gait from 0.8 m/s to > 1.0 m/s in order to improve gait and walking    Time 12   Period Weeks   Status Achieved     PT LONG TERM GOAL  #10   TITLE Pt will demo no need for a pillow under her hips and demo a more cranial position of her bladder in order to return to fitness and have  less pelvic pain    Time 12   Period Weeks   Status Achieved     PT LONG TERM GOAL  #11   TITLE Pt will demo less L lateral shift of ribcage and shoulder on inhalation, increased mm of QL in order to correct mm imbalance from tennis playing in order to minimize relapse of Sx   Time 12   Period Weeks   Status Achieved     PT LONG TERM GOAL  #12   TITLE Pt will report stairclimbing without modifications across 3 flights once to 2x day / 1 week without relapse of Sx in order to return to functions   Time 12   Period Weeks   Status New     PT LONG TERM GOAL  #13   TITLE Pt will be compliant with  <  2 hrs of sports activities across 4-5 / week without > 1 hr of stretching and relaxation daily to minimie relapse of Sx   Time 12   Period Weeks   Status New               Plan - 03/29/16 2249    Clinical Impression Statement Pt has achieved 8/13 goals across the past 12 visits.  She is progressing well with self-management of pain but has not returned to her full sports playing schedule yet. Emphasized strategies to not overdo her exercises in order  to minimize relapse of Sx. Pt remains compliant with alternative exercises to core exercises that helps to minimize load on the pelvic floor and lowered position of bladder. Modified shoulder exercises which she had previously been prescribed s/p shoulder surgery. Replaced weights with resistance band for RTC strengthening in order to optimize scapular control with her sports.  Pt demo'd poor scapular stabilizing motor control and will continue to require thoracolumbar/ scapular strengthening to account for the fact that pt has had surgery in R shoulder and the sports she plans to return to playing (tennis, golfing, swimming) involves excessive movement/ ROM of R shoulder. Pt required tactile and verbal cues to correct breathing coordination to minimize abdominal strain which likely explains her increased  tenderness at suprapubic area.Progressing forward stair navigation gradually in her HEP to return to function. PT continues to benefit from skilled PT.     Rehab Potential Good   PT Frequency 1x / week   PT Duration 12 weeks   PT Treatment/Interventions ADLs/Self Care Home Management;Aquatic Therapy;Cryotherapy;Therapeutic exercise;Therapeutic activities;Functional mobility training;Moist Heat;Neuromuscular re-education;Stair training;Patient/family education;Balance training;Manual techniques;Energy conservation;Taping   Consulted and Agree with Plan of Care Patient      Patient will benefit from skilled therapeutic intervention in order to improve the following deficits and impairments:  Pain, Improper body mechanics, Decreased coordination, Decreased mobility, Increased muscle spasms, Postural dysfunction, Decreased endurance, Decreased range of motion, Decreased strength, Hypomobility, Decreased balance, Decreased safety  awareness, Impaired flexibility  Visit Diagnosis: Other abnormalities of gait and mobility  Other lack of coordination     Problem List Patient Active Problem List   Diagnosis Date Noted  . Encounter for preventive measure 04/05/2015  . Screening for osteoporosis 08/12/2012  . 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 [place], [place], [address] Phone: [phone]   Fax:  [phone]  Name: Kaylee Gentry MRN: [id number] Date of Birth: Jul 20, 1960

## 2016-04-11 ENCOUNTER — Encounter: Payer: BLUE CROSS/BLUE SHIELD | Admitting: Physical Therapy

## 2016-04-12 ENCOUNTER — Ambulatory Visit: Payer: BLUE CROSS/BLUE SHIELD | Admitting: Physical Therapy

## 2016-04-19 ENCOUNTER — Encounter: Payer: BLUE CROSS/BLUE SHIELD | Admitting: Physical Therapy

## 2016-04-26 ENCOUNTER — Telehealth: Payer: Self-pay | Admitting: Obstetrics & Gynecology

## 2016-04-26 NOTE — Telephone Encounter (Signed)
Pt is calling wanting to know if any provider here prescription Micronized Progestron for women whom had an Hysterectomy. Please advise. Pt CB Number [phone]

## 2016-04-26 NOTE — Telephone Encounter (Signed)
When in need, yes.

## 2016-04-27 NOTE — Telephone Encounter (Signed)
Pt is schedule with [facility] on 06/06/16

## 2016-04-27 NOTE — Telephone Encounter (Signed)
Please schedule annual with [facility] pt aware u will call her

## 2016-05-03 ENCOUNTER — Encounter: Payer: BLUE CROSS/BLUE SHIELD | Admitting: Physical Therapy

## 2016-05-18 ENCOUNTER — Encounter: Payer: BLUE CROSS/BLUE SHIELD | Admitting: Physical Therapy

## 2016-05-23 ENCOUNTER — Encounter: Payer: BLUE CROSS/BLUE SHIELD | Admitting: Physical Therapy

## 2016-05-26 ENCOUNTER — Encounter: Payer: Self-pay | Admitting: Obstetrics and Gynecology

## 2016-06-03 ENCOUNTER — Encounter: Payer: Self-pay | Admitting: Obstetrics & Gynecology

## 2016-06-06 ENCOUNTER — Encounter: Payer: Self-pay | Admitting: Obstetrics & Gynecology

## 2016-06-06 ENCOUNTER — Encounter: Payer: BLUE CROSS/BLUE SHIELD | Admitting: Physical Therapy

## 2016-06-06 ENCOUNTER — Ambulatory Visit (INDEPENDENT_AMBULATORY_CARE_PROVIDER_SITE_OTHER): Payer: BLUE CROSS/BLUE SHIELD | Admitting: Obstetrics & Gynecology

## 2016-06-06 VITALS — BP 110/60 | HR 64 | Ht 63.0 in | Wt 124.0 lb

## 2016-06-06 DIAGNOSIS — Z1211 Encounter for screening for malignant neoplasm of colon: Secondary | ICD-10-CM

## 2016-06-06 DIAGNOSIS — Z01419 Encounter for gynecological examination (general) (routine) without abnormal findings: Secondary | ICD-10-CM

## 2016-06-06 DIAGNOSIS — Z1231 Encounter for screening mammogram for malignant neoplasm of breast: Secondary | ICD-10-CM

## 2016-06-06 DIAGNOSIS — Z1239 Encounter for other screening for malignant neoplasm of breast: Secondary | ICD-10-CM

## 2016-06-06 DIAGNOSIS — Z0000 Encounter for general adult medical examination without abnormal findings: Secondary | ICD-10-CM

## 2016-06-06 MED ORDER — ESTRADIOL 0.025 MG/24HR TD PTTW: 1.0000 | MEDICATED_PATCH | TRANSDERMAL | 12 refills | Status: DC

## 2016-06-06 MED ORDER — PROGESTERONE MICRONIZED 100 MG PO CAPS: 100.0000 mg | ORAL_CAPSULE | Freq: Every day | ORAL | 12 refills | Status: DC

## 2016-06-06 MED ORDER — ESTRADIOL 0.0375 MG/24HR TD PTTW: MEDICATED_PATCH | TRANSDERMAL | 12 refills | Status: DC

## 2016-06-06 NOTE — Patient Instructions (Signed)
PAP every three years Mammogram every year Colonoscopy every 10 years Labs yearly (with PCP)   

## 2016-06-06 NOTE — Progress Notes (Signed)
HPI:      [name] is a 56 y.o. G4P0040 who LMP was in the past, she presents today for her annual examination.  The patient has no complaints today. The patient is not currently sexually active. Herlast pap: was normal and then normal path w hysterectomy 10/17 and last mammogram: approximate date 2016 and was normal.  The patient does perform self breast exams.  There is no notable family history of breast or ovarian cancer in her family. The patient is taking hormone replacement therapy. Patient denies post-menopausal vaginal bleeding.   The patient has regular exercise: yes. The patient denies current symptoms of depression.    GYN Hx: Last Colonoscopy:7 years ago ago. Normal.  Last DEXA: few years ago.    PMHx: Past Medical History:  Diagnosis Date  . Allergy   . GERD (gastroesophageal reflux disease)    Past Surgical History:  Procedure Laterality Date  . CHOLECYSTECTOMY  2014  . COLONOSCOPY  05/01/2009   normal  . EYE SURGERY     iridotomy; bilateral  . SHOULDER SURGERY     right  . UPPER GASTROINTESTINAL ENDOSCOPY    . VAGINAL HYSTERECTOMY  10/22/2015   Family History  Problem Relation Age of Onset  . Hypothyroidism Mother   . Cancer Father   . Hypothyroidism Maternal Uncle        x 2  . Hypertension Maternal Uncle        x 2  . Hypothyroidism Sister    Social History  Substance Use Topics  . Smoking status: Never Smoker  . Smokeless tobacco: Never Used  . Alcohol use No    Current Outpatient Prescriptions:  .  cholecalciferol (VITAMIN D) 400 UNITS TABS, Take 400 Units by mouth daily., Disp: , Rfl:  .  EPINEPHrine 0.3 mg/0.3 mL IJ SOAJ injection, Inject 0.3 mg into the muscle., Disp: , Rfl:  .  estradiol (VIVELLE-DOT) 0.0375 MG/24HR, PLACE 1 PATCH ON THE SKIN TWO (2) TIMES A WEEK., Disp: , Rfl: 3 .  fexofenadine (ALLEGRA) 180 MG tablet, Take 180 mg by mouth daily., Disp: , Rfl:  .  Lactase 3000 UNITS CHEW, Chew 1 tablet by mouth daily., Disp: , Rfl:    .  progesterone (PROMETRIUM) 100 MG capsule, Take 1 capsule (100 mg total) by mouth daily., Disp: 30 capsule, Rfl: 12 .  acetaminophen (TYLENOL) 500 MG tablet, Take 500 mg by mouth every 6 (six) hours as needed for pain. Reported on 04/01/2015, Disp: , Rfl:  .  B Complex Vitamins (VITAMIN B COMPLEX 100 IJ), vitamin B complex, Disp: , Rfl:  .  estradiol (VIVELLE-DOT) 0.025 MG/24HR, Place 1 patch onto the skin 2 (two) times a week., Disp: 8 patch, Rfl: 12 .  ibuprofen (ADVIL,MOTRIN) 100 MG chewable tablet, Chew 100 mg by mouth every 8 (eight) hours as needed for fever. Reported on 04/01/2015, Disp: , Rfl:  .  lactase (LACTAID) 3000 units tablet, Take by mouth., Disp: , Rfl:  .  magnesium 30 MG tablet, Take 30 mg by mouth. Reported on 04/01/2015, Disp: , Rfl:  .  Misc Natural Products (APPLE CIDER VINEGAR DIET PO), Take by mouth., Disp: , Rfl:  .  Multiple Vitamins-Minerals (MULTIVITAMIN WITH MINERALS) tablet, Take 1 tablet by mouth daily., Disp: , Rfl:  .  PANCREATIN PO, Take 1 tablet by mouth daily., Disp: , Rfl:  .  PANCREATIN PO, Take by mouth., Disp: , Rfl:  .  Probiotic Product (PROBIOTIC-10 PO), Take by mouth., Disp: , Rfl:  .  Tetrahydrozoline-Zn Sulfate (EYE DROPS A/C OP), Apply 1 drop to eye 4 (four) times daily - after meals and at bedtime. Reported on 04/01/2015, Disp: , Rfl:  Allergies: Septra [sulfamethoxazole-trimethoprim]; Amoxicillin-pot clavulanate; Nitrofurantoin; Nitrofurantoin monohyd macro; Omnicef [cefdinir]; Rofecoxib; Sulfa antibiotics; and Hydrocodone-acetaminophen  Review of Systems  Constitutional: Negative for chills, fever and malaise/fatigue.  HENT: Negative for congestion, sinus pain and sore throat.   Eyes: Negative for blurred vision and pain.  Respiratory: Negative for cough and wheezing.   Cardiovascular: Negative for chest pain and leg swelling.  Gastrointestinal: Negative for abdominal pain, constipation, diarrhea, heartburn, nausea and vomiting.  Genitourinary:  Negative for dysuria, frequency, hematuria and urgency.  Musculoskeletal: Negative for back pain, joint pain, myalgias and neck pain.  Skin: Negative for itching and rash.  Neurological: Negative for dizziness, tremors and weakness.  Endo/Heme/Allergies: Does not bruise/bleed easily.  Psychiatric/Behavioral: Negative for depression. The patient is not nervous/anxious and does not have insomnia.     Objective: BP 110/60   Pulse 64   Ht 5\' 3"  (1.6 m)   Wt 124 lb (56.2 kg)   BMI 21.97 kg/m   Filed Weights   06/06/16 1327  Weight: 124 lb (56.2 kg)   Body mass index is 21.97 kg/m. Physical Exam  Constitutional: She is oriented to person, place, and time. She appears well-developed and well-nourished. No distress.  Genitourinary: Rectum normal and vagina normal. Pelvic exam was performed with patient supine. There is no rash or lesion on the right labia. There is no rash or lesion on the left labia. Vagina exhibits no lesion. No bleeding in the vagina. Right adnexum does not display mass and does not display tenderness. Left adnexum does not display mass and does not display tenderness.  Genitourinary Comments: Absent Uterus Absent cervix Vaginal cuff well healed  HENT:  Head: Normocephalic and atraumatic. Head is without laceration.  Right Ear: Hearing normal.  Left Ear: Hearing normal.  Nose: No epistaxis.  No foreign bodies.  Mouth/Throat: Uvula is midline, oropharynx is clear and moist and mucous membranes are normal.  Eyes: Pupils are equal, round, and reactive to light.  Neck: Normal range of motion. Neck supple. No thyromegaly present.  Cardiovascular: Normal rate and regular rhythm.  Exam reveals no gallop and no friction rub.   No murmur heard. Pulmonary/Chest: Effort normal and breath sounds normal. No respiratory distress. She has no wheezes. Right breast exhibits no mass, no skin change and no tenderness. Left breast exhibits no mass, no skin change and no tenderness.    Abdominal: Soft. Bowel sounds are normal. She exhibits no distension. There is no tenderness. There is no rebound.  Musculoskeletal: Normal range of motion.  Neurological: She is alert and oriented to person, place, and time. No cranial nerve deficit.  Skin: Skin is warm and dry.  Psychiatric: She has a normal mood and affect. Judgment normal.  Vitals reviewed.   Assessment: Annual Exam 1. Annual physical exam   2. Screening for breast cancer   3. Screen for colon cancer     Plan:            1.  Cervical Screening-  Pap smear schedule reviewed with patient, q 5 years  2. Breast screening- Exam annually and mammogram scheduled  3. Colonoscopy every 10 years, Hemoccult testing after age 59  4. Labs managed by PCP  5. Counseling for hormonal therapy: no change in therapy today, cont Vivelle .0375 and Prometrium reduce to 100 mcg daily or 12 d/month per pt  desire.  HRT I have discussed HRT with the patient in detail.  The risk/benefits of it were reviewed.  She understands that during menopause Estrogen decreases dramatically and that this results in an increased risk of cardiovascular disease as well as osteoporosis.  We have also discussed the fact that hot flashes often result from a decrease in Estrogen, and that by replacing Estrogen, they can often be alleviated.  We have discussed skin, vaginal and urinary tract changes that may also take place from this drop in Estrogen.  Emotional changes have also been linked to Estrogen and we have briefly discussed this.  The benefits of HRT including decrease in hot flashes, vaginal dryness, and osteoporosis were discussed.  The emotional benefit and a possible change in her cardiovascular risk profile was also reviewed.  The risks associated with Hormone Replacement Therapy were also reviewed.  The use of unopposed Estrogen and its relationship to endometrial cancer was discussed.  The addition of Progesterone and its beneficial effect on  endometrial cancer was also noted.  The fact that there has been no consistent definitive studies showing an increase in breast cancer in women who use HRT was discussed with the patient.  The possible side effects including breast tenderness, fluid retention, mood changes and vaginal bleeding were discussed.  The patient was informed that this is an elective medication and that she may choose not to take Hormone Replacement Therapy.  Literature on HRT was given, and I believe that after answering all of the patient's questions, she has an adequate and informed understanding of HRT.  Special emphasis on the WHI study, as well as several studies since that pertaining to the risks and benefits of estrogen replacement therapy were compared.  The possible limitations of these studies were discussed including the age stratification of the WHI study.  The possible role of Progesterone in these studies was discussed in detail.  I believe that the patient has an informed knowledge of the risks and benefits of HRT.  I have specifically discussed WHI findings and current updates.  Different type of hormone formulation and methods of taking hormone replacement therapy discussed.     F/U  Return in about 1 year (around 06/06/2017) for Annual.  Barnett Applebaum, MD, [facility], [facility] 06/06/2016  2:14 PM

## 2016-06-07 ENCOUNTER — Encounter: Payer: Self-pay | Admitting: Physical Therapy

## 2016-06-15 ENCOUNTER — Encounter: Payer: Self-pay | Admitting: Obstetrics & Gynecology

## 2016-06-16 ENCOUNTER — Other Ambulatory Visit: Payer: Self-pay | Admitting: Obstetrics & Gynecology

## 2016-06-16 MED ORDER — ESTRADIOL 0.0375 MG/24HR TD PTTW: MEDICATED_PATCH | TRANSDERMAL | 4 refills | Status: AC

## 2016-06-16 MED ORDER — PROGESTERONE MICRONIZED 100 MG PO CAPS: 100.0000 mg | ORAL_CAPSULE | Freq: Every day | ORAL | 4 refills | Status: DC

## 2016-06-20 ENCOUNTER — Encounter: Payer: BLUE CROSS/BLUE SHIELD | Admitting: Physical Therapy

## 2016-07-04 ENCOUNTER — Encounter: Payer: BLUE CROSS/BLUE SHIELD | Admitting: Physical Therapy

## 2016-07-21 ENCOUNTER — Ambulatory Visit (INDEPENDENT_AMBULATORY_CARE_PROVIDER_SITE_OTHER): Payer: Self-pay

## 2016-07-21 ENCOUNTER — Encounter (INDEPENDENT_AMBULATORY_CARE_PROVIDER_SITE_OTHER): Payer: Self-pay | Admitting: Orthopedic Surgery

## 2016-07-21 ENCOUNTER — Ambulatory Visit (INDEPENDENT_AMBULATORY_CARE_PROVIDER_SITE_OTHER): Payer: BLUE CROSS/BLUE SHIELD | Admitting: Orthopedic Surgery

## 2016-07-21 DIAGNOSIS — M542 Cervicalgia: Secondary | ICD-10-CM | POA: Diagnosis not present

## 2016-07-21 DIAGNOSIS — G8929 Other chronic pain: Secondary | ICD-10-CM | POA: Diagnosis not present

## 2016-07-21 DIAGNOSIS — M25511 Pain in right shoulder: Secondary | ICD-10-CM

## 2016-07-21 DIAGNOSIS — M25512 Pain in left shoulder: Secondary | ICD-10-CM

## 2016-07-21 MED ORDER — DICLOFENAC SODIUM 2 % TD SOLN: 2.0000 | Freq: Two times a day (BID) | TRANSDERMAL | 2 refills | Status: DC

## 2016-07-21 MED ORDER — DICLOFENAC SODIUM 2 % TD SOLN: 2.0000 | Freq: Two times a day (BID) | TRANSDERMAL | 2 refills | Status: AC

## 2016-07-21 NOTE — Progress Notes (Signed)
Office Visit Note   Patient: Kaylee Gentry           Date of Birth: 10-18-1960           MRN: [id number] Visit Date: 07/21/2016 Requested by: No referring provider defined for this encounter. PCP: Patient, No Pcp Per  Subjective: Chief Complaint  Patient presents with  . Right Shoulder - Pain  . Left Shoulder - Pain    HPI: Kaylee Gentry is a 56 year old active female with right shoulder and left shoulder pain as well as some mild neck stiffness and right hand tingling in the fingertips.  She has had previous right shoulder surgery the pictures which are reviewed.  She had anterior SLAP tear repair in 2008.  Has done pretty well since then until March.  At that time she was doing a home exercise program and she developed fairly acute onset of shoulder pain which has persisted since that time.  She is very active and likes to play tennis cough and bike.  She is not able to do any of that now because of her right shoulder pain.  In regards to the right hand she has a history of carpal tunnel release over 10 years ago.  She's having primarily fingertip tingling.  No real weakness.  She states she cannot sleep well.  She is afraid to lift anything because of the shoulder pain.  She has several locations of the shoulder pain on the right-hand side which is primarily anterior but there are some of the bottom of the deltoid as well.  On the left-hand side the shoulder symptoms are less severe but also primarily anterior.              ROS: All systems reviewed are negative as they relate to the chief complaint within the history of present illness.  Patient denies  fevers or chills.  Assessment & Plan: Visit Diagnoses:  1. Chronic right shoulder pain   2. Chronic left shoulder pain   3. Neck pain     Plan: Impression is possible right-sided radiculopathy from the neck with tingling in the fingertips and pain which does radiate at times down the posterior aspect of the shoulder but not in the shoulder  blade.  Plain radiographs of the cervical spine fairly nondiagnostic.  She does have some pain when she puts the nerve roots on stretch.  In regards to the shoulder I think she has a reasonable chance of having intrinsic pathology in the shoulder based on her mechanism of injury which is primarily external rotation at 15 of abduction and followed by resisted external rotation at 90 of abduction.  Conceivably she has either rotator cuff strain or recurrent labral pathology.  Not much in the way of mechanical symptoms on exam today but does have pain.  Because of duration of symptoms plan MRI arthrogram right shoulder to evaluate for possible recurrent labral tear.  Rotator cuff pathology less likely.  I'll see her back after the studies .  We could consider nerve conduction studies on bilateral wrist in the future if the study on the neck is negative Follow-Up Instructions: No Follow-up on file.   Orders:  Orders Placed This Encounter  Procedures  . XR Shoulder Right  . XR Shoulder Left  . XR Cervical Spine 2 or 3 views  . MR Cervical Spine w/o contrast  . Arthrogram  . MR SHOULDER RIGHT W CONTRAST   No orders of the defined types were placed in this encounter.  Procedures: No procedures performed   Clinical Data: No additional findings.  Objective: Vital Signs: There were no vitals taken for this visit.  Physical Exam:   Constitutional: Patient appears well-developed HEENT:  Head: Normocephalic Eyes:EOM are normal Neck: Normal range of motion Cardiovascular: Normal rate Pulmonary/chest: Effort normal Neurologic: Patient is alert Skin: Skin is warm Psychiatric: Patient has normal mood and affect    Ortho Exam: Orthopedic exam demonstrates very fit appearing female with reasonable neck range of motion but some reproduction of symptoms with rotation of the head to the left.  She has good flexion and extension.  5 out of 5 grip EPL FPL interosseous wrist flexion-extension  biceps triceps and upper strength.  No subluxation of the ulnar nerve on the right.  She has full external rotation to about 80 bilaterally at 15 of abduction.  Full forward flexion as well as no restriction of passive range of motion on either the right or left shoulder.  Motor sensory function to the hand is intact.  No abductor pollicis brevis wasting.  She has good rotator cuff strength testing bilaterally to isolated infraspinatus super space and subscap muscle testing.  O'Brien's testing equivocal on the right negative on the left.  No other masses lymph adenopathy or skin changes noted in the shoulder region.  No acromioclavicular joint tenderness is noted.  Specialty Comments:  No specialty comments available.  Imaging: Xr Shoulder Left  Result Date: 07/21/2016 AP outlet and axillary view left shoulder reviewed.  Visualized lung fields clear.  Shoulder is located.  Acromiohumeral distance is maintained.  Acromioclavicular joint has no degenerative changes.  Shoulder is located.  No fracture.  No other abnormal soft tissue calcifications present.  Xr Shoulder Right  Result Date: 07/21/2016 Right shoulder AP outlet and axillary reviewed.  Evidence of prior labral repair seen in the glenoid.  2 holes also present in the superior aspect of the intertubercular bicipital groove.  Lung fields visualized normal.  Shoulder is located.  Acromiohumeral distance is maintained.  No acromioclavicular joint degenerative changes noted.    PMFS History: Patient Active Problem List   Diagnosis Date Noted  . Encounter for preventive measure 04/05/2015  . Screening for osteoporosis 08/12/2012  . 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 Allergy   . GERD (gastroesophageal reflux disease)     Family History  Problem Relation Age of Onset  . Hypothyroidism Mother   . Cancer Father   . Hypothyroidism Maternal Uncle        x 2  . Hypertension  Maternal Uncle        x 2  . Hypothyroidism Sister     Past Surgical History:  Procedure Laterality Date  . CHOLECYSTECTOMY  2014  . COLONOSCOPY  05/01/2009   normal  . EYE SURGERY     iridotomy; bilateral  . SHOULDER SURGERY     right  . UPPER GASTROINTESTINAL ENDOSCOPY    . VAGINAL HYSTERECTOMY  10/22/2015   Social History   Occupational History  . Government social research officer    Social History Main Topics  . Smoking status: Never Smoker  . Smokeless tobacco: Never Used  . Alcohol use No  . Drug use: No  . Sexual activity: Not Currently    Birth control/ protection: Surgical     Comment: due to pain and hysterectomy

## 2016-07-21 NOTE — Addendum Note (Signed)
Addended by: Brand Males E on: 07/21/2016 01:04 PM   Modules accepted: Orders

## 2016-07-24 ENCOUNTER — Encounter (INDEPENDENT_AMBULATORY_CARE_PROVIDER_SITE_OTHER): Payer: Self-pay | Admitting: Orthopedic Surgery

## 2016-08-04 ENCOUNTER — Ambulatory Visit
Admission: RE | Admit: 2016-08-04 | Discharge: 2016-08-04 | Disposition: A | Discharge status: Home / Self Care | Payer: BLUE CROSS/BLUE SHIELD | Source: Ambulatory Visit | Attending: Internal Medicine | Admitting: Internal Medicine

## 2016-08-04 ENCOUNTER — Other Ambulatory Visit: Payer: Self-pay | Admitting: Internal Medicine

## 2016-08-04 DIAGNOSIS — R109 Unspecified abdominal pain: Secondary | ICD-10-CM

## 2016-08-04 DIAGNOSIS — R110 Nausea: Secondary | ICD-10-CM

## 2016-08-04 DIAGNOSIS — K760 Fatty (change of) liver, not elsewhere classified: Secondary | ICD-10-CM | POA: Insufficient documentation

## 2016-08-04 MED ORDER — IOPAMIDOL (ISOVUE-300) INJECTION 61%
100.0000 mL | Freq: Once | INTRAVENOUS | Status: AC | PRN
  Administered 2016-08-04: 100 mL via INTRAVENOUS

## 2016-10-12 NOTE — Telephone Encounter (Signed)
Error

## 2016-12-20 DIAGNOSIS — J3489 Other specified disorders of nose and nasal sinuses: Secondary | ICD-10-CM | POA: Insufficient documentation

## 2017-01-03 DIAGNOSIS — S4380XA Sprain of other specified parts of unspecified shoulder girdle, initial encounter: Secondary | ICD-10-CM | POA: Insufficient documentation

## 2017-01-03 DIAGNOSIS — M50321 Other cervical disc degeneration at C4-C5 level: Secondary | ICD-10-CM | POA: Insufficient documentation

## 2017-01-03 DIAGNOSIS — M7530 Calcific tendinitis of unspecified shoulder: Secondary | ICD-10-CM | POA: Insufficient documentation

## 2017-01-04 ENCOUNTER — Encounter: Payer: Self-pay | Admitting: Podiatry

## 2017-01-04 ENCOUNTER — Ambulatory Visit (INDEPENDENT_AMBULATORY_CARE_PROVIDER_SITE_OTHER): Payer: BLUE CROSS/BLUE SHIELD | Admitting: Podiatry

## 2017-01-04 DIAGNOSIS — L603 Nail dystrophy: Secondary | ICD-10-CM | POA: Diagnosis not present

## 2017-01-04 DIAGNOSIS — N926 Irregular menstruation, unspecified: Secondary | ICD-10-CM | POA: Insufficient documentation

## 2017-01-04 DIAGNOSIS — M791 Myalgia, unspecified site: Secondary | ICD-10-CM | POA: Insufficient documentation

## 2017-01-04 DIAGNOSIS — R195 Other fecal abnormalities: Secondary | ICD-10-CM | POA: Insufficient documentation

## 2017-01-04 DIAGNOSIS — M25519 Pain in unspecified shoulder: Secondary | ICD-10-CM | POA: Insufficient documentation

## 2017-01-04 DIAGNOSIS — D259 Leiomyoma of uterus, unspecified: Secondary | ICD-10-CM | POA: Insufficient documentation

## 2017-01-04 DIAGNOSIS — N959 Unspecified menopausal and perimenopausal disorder: Secondary | ICD-10-CM | POA: Insufficient documentation

## 2017-01-04 DIAGNOSIS — E739 Lactose intolerance, unspecified: Secondary | ICD-10-CM | POA: Insufficient documentation

## 2017-01-04 DIAGNOSIS — D4989 Neoplasm of unspecified behavior of other specified sites: Secondary | ICD-10-CM | POA: Insufficient documentation

## 2017-01-04 DIAGNOSIS — N939 Abnormal uterine and vaginal bleeding, unspecified: Secondary | ICD-10-CM | POA: Insufficient documentation

## 2017-01-04 DIAGNOSIS — M8580 Other specified disorders of bone density and structure, unspecified site: Secondary | ICD-10-CM | POA: Insufficient documentation

## 2017-01-04 DIAGNOSIS — N952 Postmenopausal atrophic vaginitis: Secondary | ICD-10-CM | POA: Insufficient documentation

## 2017-01-04 DIAGNOSIS — N951 Menopausal and female climacteric states: Secondary | ICD-10-CM | POA: Insufficient documentation

## 2017-01-04 NOTE — Addendum Note (Signed)
Addended by: Clovis Riley E on: 01/04/2017 03:56 PM   Modules accepted: Orders

## 2017-01-04 NOTE — Progress Notes (Signed)
Subjective:  Patient ID: Kaylee Gentry, female    DOB: 1960/04/29,  MRN: [id number] HPI Chief Complaint  Patient presents with  . Nail Problem    Hallux nails bilateral - patient states her toenails "look bad", however could not visually see them because she wanted to leave her socks and shoes on until the doctor cam in to see her, she has tried tea tree oil, no help    56 y.o. female presents with the above complaint.     Past Medical History:  Diagnosis Date  . Allergy   . GERD (gastroesophageal reflux disease)    Past Surgical History:  Procedure Laterality Date  . CHOLECYSTECTOMY  2014  . COLONOSCOPY  05/01/2009   normal  . EYE SURGERY     iridotomy; bilateral  . SHOULDER SURGERY     right  . UPPER GASTROINTESTINAL ENDOSCOPY    . VAGINAL HYSTERECTOMY  10/22/2015    Current Outpatient Medications:  .  B Complex Vitamins (VITAMIN B COMPLEX 100 IJ), vitamin B complex, Disp: , Rfl:  .  cholecalciferol (VITAMIN D) 400 UNITS TABS, Take 400 Units by mouth daily., Disp: , Rfl:  .  Diclofenac Sodium (PENNSAID) 2 % SOLN, Place 2 Squirts onto the skin 2 (two) times daily., Disp: 1 Bottle, Rfl: 2 .  EPINEPHrine 0.3 mg/0.3 mL IJ SOAJ injection, Inject 0.3 mg into the muscle., Disp: , Rfl:  .  estradiol (VIVELLE-DOT) 0.0375 MG/24HR, PLACE 1 PATCH ON THE SKIN TWO (2) TIMES A WEEK., Disp: 24 patch, Rfl: 4 .  fexofenadine (ALLEGRA) 180 MG tablet, Take 180 mg by mouth daily., Disp: , Rfl:  .  Multiple Vitamins-Minerals (MULTIVITAMIN WITH MINERALS) tablet, Take 1 tablet by mouth daily., Disp: , Rfl:  .  Probiotic Product (PROBIOTIC-10 PO), Take by mouth., Disp: , Rfl:  .  progesterone (PROMETRIUM) 100 MG capsule, Take 1 capsule (100 mg total) by mouth daily., Disp: 90 capsule, Rfl: 4  Allergies  Allergen Reactions  . Septra [Sulfamethoxazole-Trimethoprim] Rash and Hives  . Nitrofurantoin Other (See Comments)    Tingling in hands and feet, tight muscles Tingling in hands and feet,  tight muscles  . Nitrofurantoin Monohyd Macro   . Omnicef [Cefdinir] Other (See Comments)    Muscle pain  . Sulfa Antibiotics   . Hydrocodone-Acetaminophen Nausea And Vomiting    Overheating. Called 911   Review of Systems  All other systems reviewed and are negative.  Objective:  There were no vitals filed for this visit.  General: Well developed, nourished, in no acute distress, alert and oriented x3   Dermatological: Skin is warm, dry and supple bilateral. Nails x 10 are well maintained; remaining integument appears unremarkable at this time. There are no open sores, no preulcerative lesions, no rash or signs of infection present.  Hallux nails bilaterally demonstrate thick more opaque nail with a distal onycholysis consistent with nail dystrophy.  The remainder of the surrounding skin and lesser nails appear to be perfect I see no rashes or any signs of infection.  Vascular: Dorsalis Pedis artery and Posterior Tibial artery pedal pulses are 2/4 bilateral with immedate capillary fill time. Pedal hair growth present. No varicosities and no lower extremity edema present bilateral.   Neruologic: Grossly intact via light touch bilateral. Vibratory intact via tuning fork bilateral. Protective threshold with Semmes Wienstein monofilament intact to all pedal sites bilateral. Patellar and Achilles deep tendon reflexes 2+ bilateral. No Babinski or clonus noted bilateral.   Musculoskeletal: No gross boney pedal deformities bilateral.  No pain, crepitus, or limitation noted with foot and ankle range of motion bilateral. Muscular strength 5/5 in all groups tested bilateral.  Gait: Unassisted, Nonantalgic.    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  Plan: Samples of the skin and nail were taken today to be sent for pathologic evaluation.  We will notify her S of those results.  She does not have an appointment to follow-up.     Chamari Cutbirth T.  [place], [place]

## 2017-01-18 ENCOUNTER — Telehealth: Payer: Self-pay | Admitting: *Deleted

## 2017-01-18 NOTE — Telephone Encounter (Signed)
-----   Message from Garrel Ridgel, [place] sent at 01/18/2017  7:17 AM EST ----- Negative for fungus.  Micro trauma with nail dystrophy.

## 2017-01-18 NOTE — Telephone Encounter (Signed)
Left message with pt's husband to call for lab results.

## 2017-01-19 NOTE — Telephone Encounter (Signed)
Pt called for fungal culture results. I informed pt of Dr. Stephenie Acres review of results and offered [id number] to help soften nails for filing thin or trimming. Pt states she would like to think about it.

## 2017-08-14 ENCOUNTER — Telehealth: Payer: Self-pay

## 2017-08-14 NOTE — Telephone Encounter (Signed)
Called pt to advise her to get mammo, she states she waited last year till September because she hates them so much, so she has to wait until September.

## 2017-08-14 NOTE — Telephone Encounter (Signed)
-----   Message from Gae Dry, MD sent at 08/12/2017  9:46 AM EDT ----- Regarding: MMG Received notice she has not received MMG yet as ordered at her Annual. Please check and encourage her to do this, and document conversation.

## 2017-11-20 ENCOUNTER — Telehealth: Payer: Self-pay | Admitting: Obstetrics & Gynecology

## 2017-11-20 NOTE — Telephone Encounter (Signed)
-----   Message from Gae Dry, MD sent at 11/20/2017  8:05 AM EST ----- Regarding: sch ANNUAL w me plz Needs Annual, MMG etc

## 2017-11-20 NOTE — Telephone Encounter (Signed)
Called and spoke with patient to schedule annual with [facility]. Patient will call back to schedule.

## 2017-12-12 ENCOUNTER — Other Ambulatory Visit: Payer: Self-pay | Admitting: Obstetrics & Gynecology

## 2018-05-07 ENCOUNTER — Ambulatory Visit: Payer: Self-pay | Admitting: Family Medicine

## 2018-06-06 ENCOUNTER — Ambulatory Visit: Payer: BLUE CROSS/BLUE SHIELD | Admitting: Obstetrics & Gynecology

## 2018-06-14 IMAGING — CT CT ABD-PELV W/ CM
1 of 3 series · 13 of 32 positions shown, 18 images · IV contrast (APPLIED)
Comparison: November 09, 2012

CLINICAL DATA: Left lower quadrant and left flank pain for 3 days.
Constipation.

EXAM:
CT ABDOMEN AND PELVIS WITH CONTRAST
TECHNIQUE: Multidetector CT imaging of the abdomen and pelvis was performed
using the standard protocol following bolus administration of
intravenous contrast. Oral contrast was also administered.
CONTRAST:  100mL [id number] IOPAMIDOL (VWC6LV-BKK) INJECTION 61%

[Series 2: axial st · axial · 0.65mm/px · z∈[-969,-589]mm · 13 of 88 slices shown, 18 images]
[im 6/88  soft-tissue]
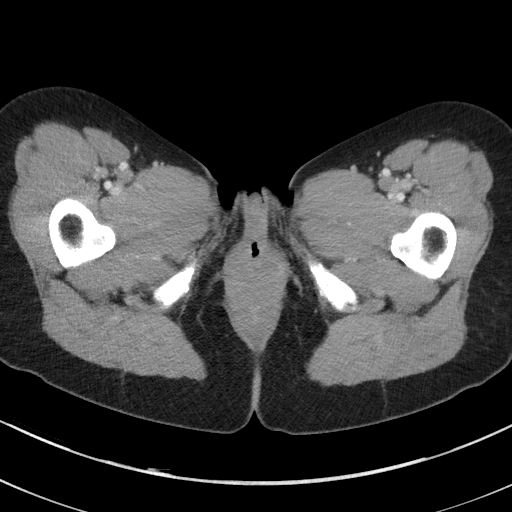
[im 6/88  bone]
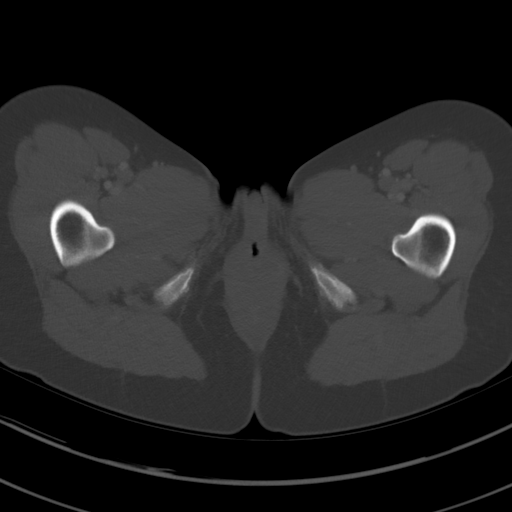
[im 16/88  soft-tissue]
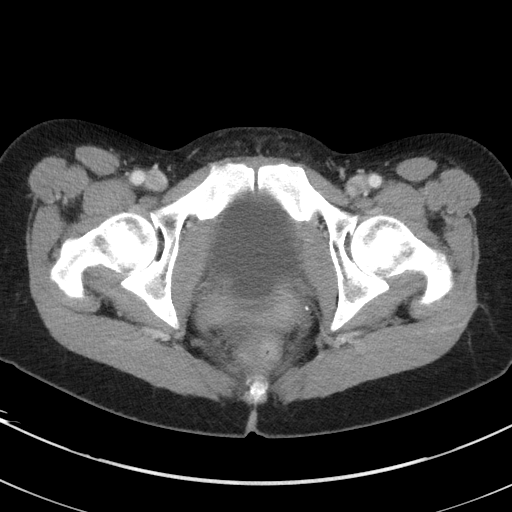
[im 21/88  soft-tissue]
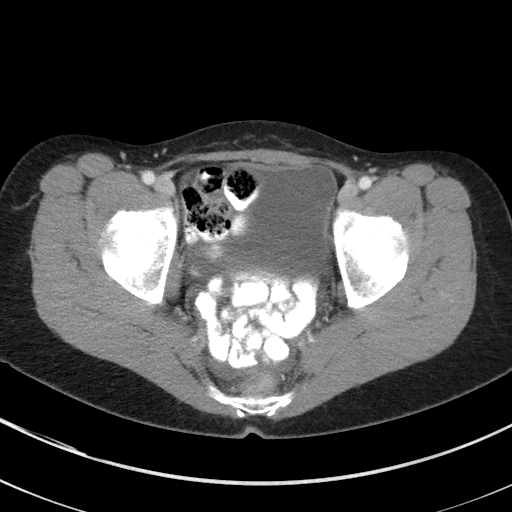
[im 26/88  soft-tissue]
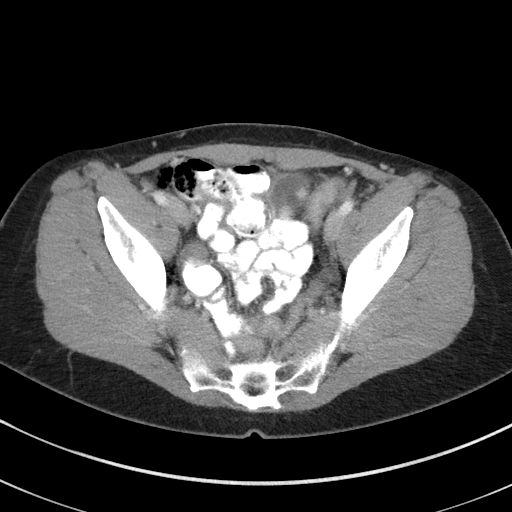
[im 36/88  soft-tissue]
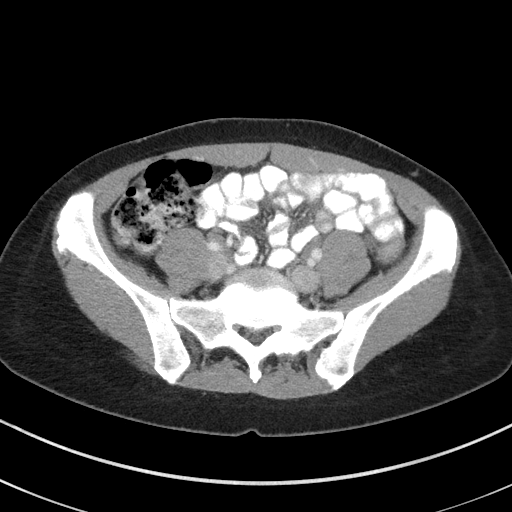
[im 41/88  soft-tissue]
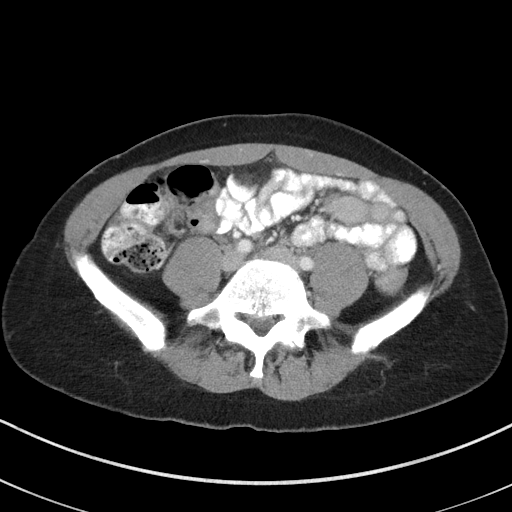
[im 47/88  soft-tissue]
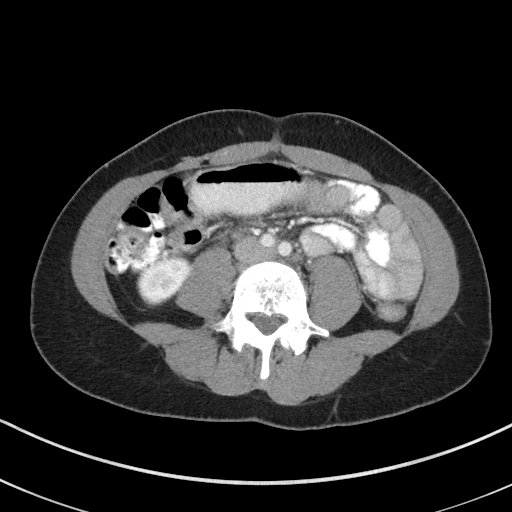
[im 57/88  soft-tissue]
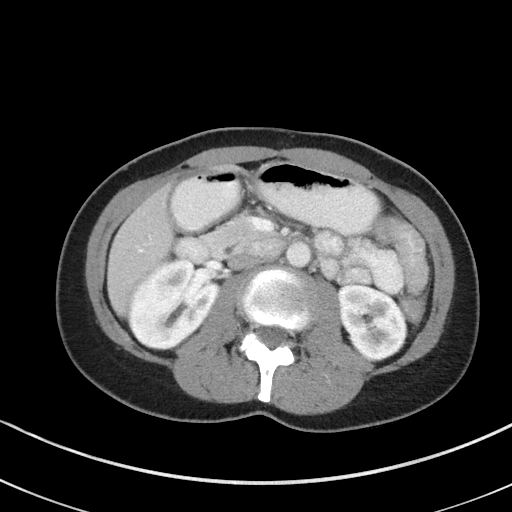
[im 62/88  soft-tissue]
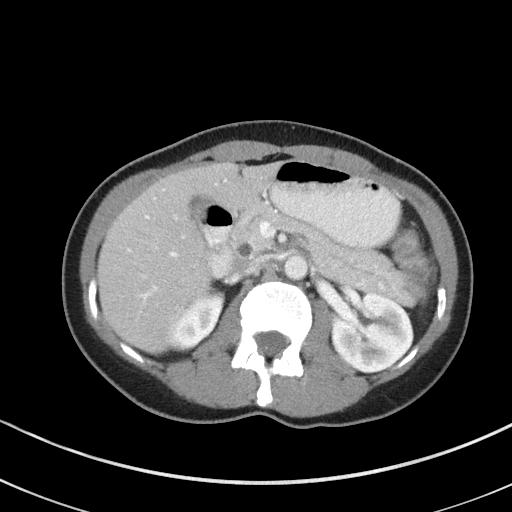
[im 62/88  bone]
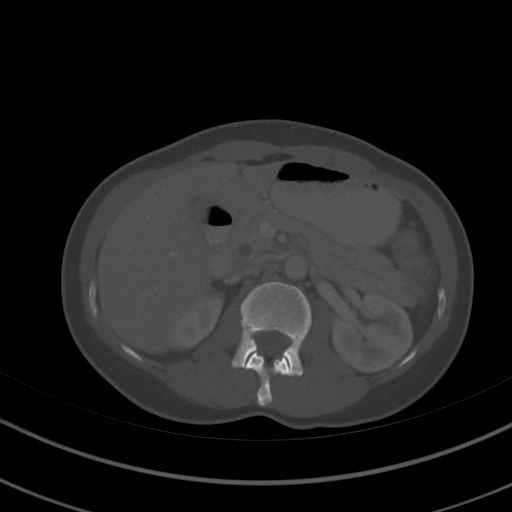
[im 67/88  soft-tissue]
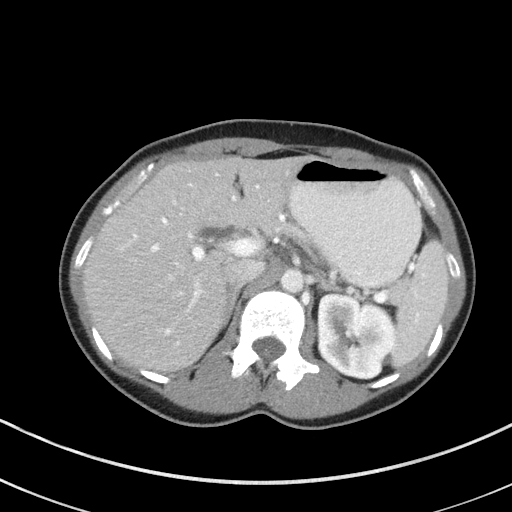
[im 67/88  lung]
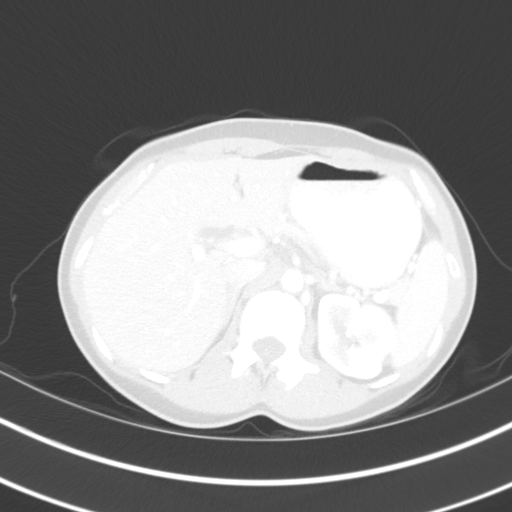
[im 72/88  lung]
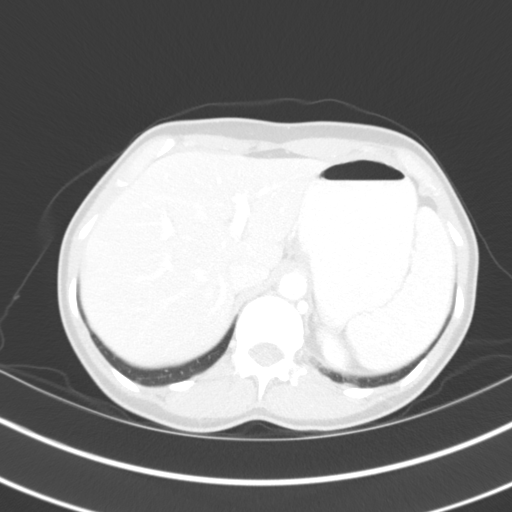
[im 77/88  soft-tissue]
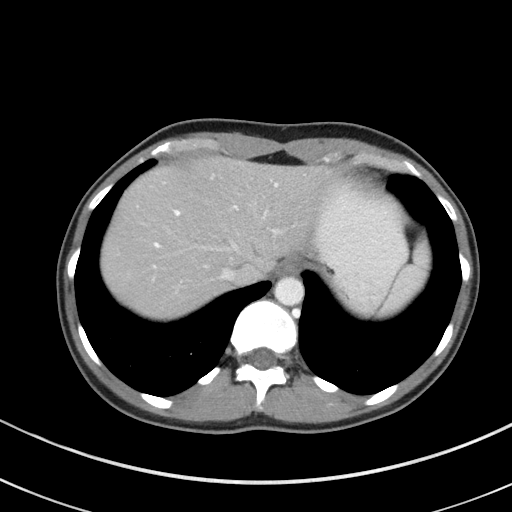
[im 77/88  lung]
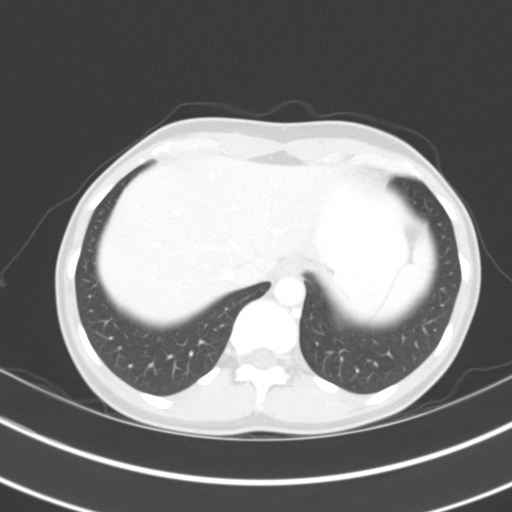
[im 82/88  soft-tissue]
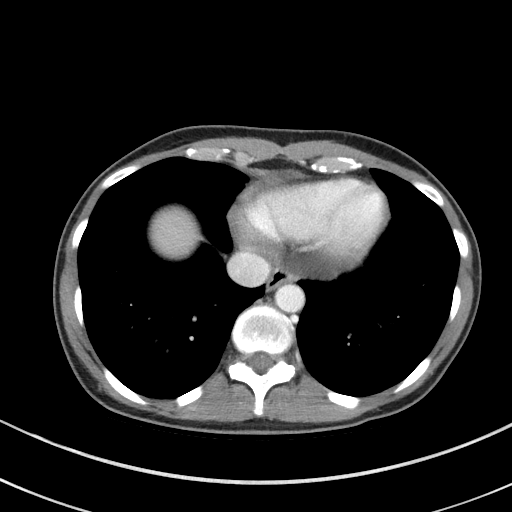
[im 82/88  lung]
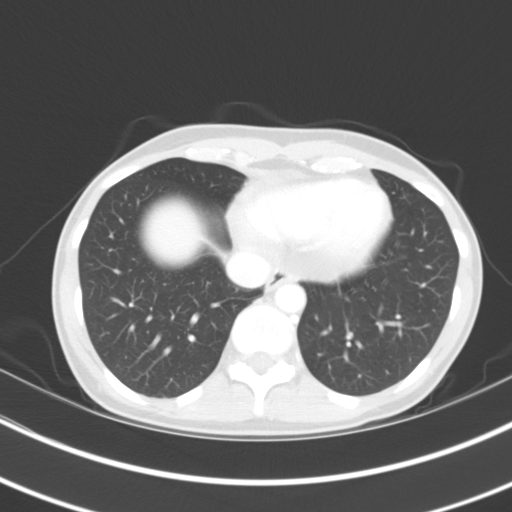

[13 of 32 positions shown; findings below may reference images not displayed]

FINDINGS: Lower chest: There is no lung base edema or consolidation.

Hepatobiliary: There appears to be a degree of hepatic steatosis. No
focal liver lesions are apparent. Gallbladder is absent. There is no
biliary duct dilatation.

Pancreas: There is no appreciable pancreatic mass or inflammatory
focus.

Spleen: No splenic lesions are evident.

Adrenals/Urinary Tract: Adrenals appear unremarkable bilaterally.
Kidneys bilaterally show no evident mass or hydronephrosis. There is
a probable calyceal diverticulum in the mid right kidney. There is
no renal or ureteral calculus on either side. Urinary bladder is
midline with wall thickness within normal limits.

Stomach/Bowel: Stomach is slightly distended with fluid and air.
There is mild wall thickening in the distal gastric antrum and
pylorus region. No fistula seen in this area. Elsewhere, there is no
bowel wall or mesenteric thickening. No evident bowel obstruction.
No free air or portal venous air.

Vascular/Lymphatic: There is no abdominal aortic aneurysm. No
vascular lesions are evident. There is no appreciable adenopathy in
the abdomen or pelvis.

Reproductive: Uterus is absent. No pelvic mass evident. A small
amount of free fluid is noted in the dependent portion of the
pelvis.

Other: Appendix not seen. No peri appendiceal region inflammation.
No abscess seen in the abdomen or pelvis.

Musculoskeletal: There are no blastic or lytic bone lesions. There
is mild lumbar levoscoliosis. There is no intramuscular or abdominal
wall lesion.
IMPRESSION: 1. Focal wall thickening in the distal gastric antrum and pylorus.
Suspect a degree of distal gastritis. No other bowel wall
thickening. No bowel obstruction. No evident free air. No fistula
seen.

2.  No renal or ureteral calculus.  No hydronephrosis.

3. Small amount of free fluid in the dependent portion of pelvis of
uncertain etiology. Question ovarian cyst rupture. There is no
evident pelvic mass on this study.

4.  Gallbladder and uterus absent.

5. Evidence a degree of hepatic steatosis. No focal liver lesions
evident.

These results will be called to the ordering clinician or
representative by the [HOSPITAL] at the imaging location.

## 2018-06-19 ENCOUNTER — Ambulatory Visit: Payer: Self-pay | Admitting: Family Medicine

## 2018-06-20 ENCOUNTER — Ambulatory Visit: Payer: Self-pay | Admitting: Family Medicine

## 2018-06-25 ENCOUNTER — Ambulatory Visit: Payer: Self-pay | Admitting: Family Medicine

## 2018-06-27 ENCOUNTER — Ambulatory Visit: Payer: Self-pay | Admitting: Family Medicine

## 2018-07-02 ENCOUNTER — Ambulatory Visit: Payer: BLUE CROSS/BLUE SHIELD | Admitting: Obstetrics & Gynecology

## 2020-06-23 ENCOUNTER — Other Ambulatory Visit (HOSPITAL_COMMUNITY): Payer: Self-pay | Admitting: Family Medicine

## 2020-06-23 ENCOUNTER — Other Ambulatory Visit: Payer: Self-pay | Admitting: Family Medicine

## 2020-06-23 DIAGNOSIS — G8929 Other chronic pain: Secondary | ICD-10-CM

## 2020-06-23 DIAGNOSIS — M5441 Lumbago with sciatica, right side: Secondary | ICD-10-CM
